# Patient Record
Sex: Female | Born: 1955 | Race: White | Hispanic: No | Marital: Married | Smoking: Never smoker
Health system: Southern US, Community
[De-identification: ages and names within clinical notes are randomized; demographics above are authoritative.]

## PROBLEM LIST (undated history)

## (undated) DIAGNOSIS — B019 Varicella without complication: Secondary | ICD-10-CM

## (undated) DIAGNOSIS — R87619 Unspecified abnormal cytological findings in specimens from cervix uteri: Secondary | ICD-10-CM

## (undated) DIAGNOSIS — R011 Cardiac murmur, unspecified: Secondary | ICD-10-CM

## (undated) DIAGNOSIS — T7840XA Allergy, unspecified, initial encounter: Secondary | ICD-10-CM

## (undated) DIAGNOSIS — M8580 Other specified disorders of bone density and structure, unspecified site: Secondary | ICD-10-CM

## (undated) DIAGNOSIS — E079 Disorder of thyroid, unspecified: Secondary | ICD-10-CM

## (undated) DIAGNOSIS — M1990 Unspecified osteoarthritis, unspecified site: Secondary | ICD-10-CM

## (undated) HISTORY — DX: Unspecified osteoarthritis, unspecified site: M19.90

## (undated) HISTORY — PX: COLONOSCOPY: SHX174

## (undated) HISTORY — PX: PILONIDAL CYST DRAINAGE: SHX743

## (undated) HISTORY — DX: Allergy, unspecified, initial encounter: T78.40XA

## (undated) HISTORY — PX: BREAST CYST ASPIRATION: SHX578

## (undated) HISTORY — DX: Varicella without complication: B01.9

## (undated) HISTORY — DX: Disorder of thyroid, unspecified: E07.9

## (undated) HISTORY — DX: Cardiac murmur, unspecified: R01.1

## (undated) HISTORY — DX: Other specified disorders of bone density and structure, unspecified site: M85.80

## (undated) HISTORY — DX: Unspecified abnormal cytological findings in specimens from cervix uteri: R87.619

## (undated) HISTORY — PX: TONSILLECTOMY AND ADENOIDECTOMY: SHX28

## (undated) HISTORY — PX: ABDOMINAL HYSTERECTOMY: SHX81

---

## 1997-02-25 ENCOUNTER — Ambulatory Visit (HOSPITAL_COMMUNITY): Admission: RE | Admit: 1997-02-25 | Discharge: 1997-02-25 | Payer: Self-pay | Admitting: Obstetrics and Gynecology

## 1998-03-05 ENCOUNTER — Encounter: Payer: Self-pay | Admitting: Obstetrics and Gynecology

## 1998-03-05 ENCOUNTER — Ambulatory Visit (HOSPITAL_COMMUNITY): Admission: RE | Admit: 1998-03-05 | Discharge: 1998-03-05 | Payer: Self-pay | Admitting: Obstetrics and Gynecology

## 1999-03-21 ENCOUNTER — Encounter: Payer: Self-pay | Admitting: Obstetrics and Gynecology

## 1999-03-21 ENCOUNTER — Ambulatory Visit (HOSPITAL_COMMUNITY): Admission: RE | Admit: 1999-03-21 | Discharge: 1999-03-21 | Payer: Self-pay | Admitting: Obstetrics and Gynecology

## 2000-07-23 ENCOUNTER — Ambulatory Visit (HOSPITAL_COMMUNITY): Admission: RE | Admit: 2000-07-23 | Discharge: 2000-07-23 | Payer: Self-pay | Admitting: Obstetrics and Gynecology

## 2000-07-23 ENCOUNTER — Encounter: Payer: Self-pay | Admitting: Obstetrics and Gynecology

## 2001-05-09 ENCOUNTER — Other Ambulatory Visit: Admission: RE | Admit: 2001-05-09 | Discharge: 2001-05-09 | Payer: Self-pay | Admitting: Obstetrics and Gynecology

## 2001-12-17 ENCOUNTER — Ambulatory Visit (HOSPITAL_COMMUNITY): Admission: RE | Admit: 2001-12-17 | Discharge: 2001-12-17 | Payer: Self-pay | Admitting: Obstetrics and Gynecology

## 2001-12-17 ENCOUNTER — Encounter: Payer: Self-pay | Admitting: Obstetrics and Gynecology

## 2001-12-20 ENCOUNTER — Encounter: Payer: Self-pay | Admitting: Obstetrics and Gynecology

## 2001-12-20 ENCOUNTER — Encounter: Admission: RE | Admit: 2001-12-20 | Discharge: 2001-12-20 | Payer: Self-pay | Admitting: General Surgery

## 2002-06-17 ENCOUNTER — Encounter: Payer: Self-pay | Admitting: Obstetrics and Gynecology

## 2002-06-17 ENCOUNTER — Encounter: Admission: RE | Admit: 2002-06-17 | Discharge: 2002-06-17 | Payer: Self-pay | Admitting: Obstetrics and Gynecology

## 2002-08-21 ENCOUNTER — Other Ambulatory Visit: Admission: RE | Admit: 2002-08-21 | Discharge: 2002-08-21 | Payer: Self-pay | Admitting: Obstetrics and Gynecology

## 2003-01-02 ENCOUNTER — Encounter: Admission: RE | Admit: 2003-01-02 | Discharge: 2003-01-02 | Payer: Self-pay | Admitting: Obstetrics and Gynecology

## 2003-01-26 ENCOUNTER — Encounter: Admission: RE | Admit: 2003-01-26 | Discharge: 2003-01-26 | Payer: Self-pay | Admitting: Obstetrics and Gynecology

## 2003-05-08 ENCOUNTER — Encounter: Admission: RE | Admit: 2003-05-08 | Discharge: 2003-05-08 | Payer: Self-pay | Admitting: Obstetrics and Gynecology

## 2003-09-14 ENCOUNTER — Other Ambulatory Visit: Admission: RE | Admit: 2003-09-14 | Discharge: 2003-09-14 | Payer: Self-pay | Admitting: Obstetrics and Gynecology

## 2004-02-18 ENCOUNTER — Ambulatory Visit: Payer: Self-pay | Admitting: Internal Medicine

## 2004-02-23 ENCOUNTER — Encounter: Admission: RE | Admit: 2004-02-23 | Discharge: 2004-02-23 | Payer: Self-pay | Admitting: Obstetrics and Gynecology

## 2004-04-26 ENCOUNTER — Ambulatory Visit: Payer: Self-pay | Admitting: Internal Medicine

## 2004-05-10 ENCOUNTER — Ambulatory Visit: Payer: Self-pay | Admitting: Internal Medicine

## 2004-10-11 ENCOUNTER — Other Ambulatory Visit: Admission: RE | Admit: 2004-10-11 | Discharge: 2004-10-11 | Payer: Self-pay | Admitting: Obstetrics and Gynecology

## 2004-10-13 ENCOUNTER — Ambulatory Visit: Payer: Self-pay | Admitting: Internal Medicine

## 2005-03-09 ENCOUNTER — Encounter: Admission: RE | Admit: 2005-03-09 | Discharge: 2005-03-09 | Payer: Self-pay | Admitting: Obstetrics and Gynecology

## 2005-03-29 ENCOUNTER — Encounter: Admission: RE | Admit: 2005-03-29 | Discharge: 2005-03-29 | Payer: Self-pay | Admitting: Obstetrics and Gynecology

## 2005-06-20 ENCOUNTER — Ambulatory Visit: Payer: Self-pay | Admitting: Internal Medicine

## 2005-10-23 ENCOUNTER — Other Ambulatory Visit: Admission: RE | Admit: 2005-10-23 | Discharge: 2005-10-23 | Payer: Self-pay | Admitting: Obstetrics and Gynecology

## 2006-05-14 ENCOUNTER — Encounter: Admission: RE | Admit: 2006-05-14 | Discharge: 2006-05-14 | Payer: Self-pay | Admitting: Obstetrics and Gynecology

## 2006-10-24 DIAGNOSIS — M8580 Other specified disorders of bone density and structure, unspecified site: Secondary | ICD-10-CM | POA: Insufficient documentation

## 2006-10-24 DIAGNOSIS — J309 Allergic rhinitis, unspecified: Secondary | ICD-10-CM | POA: Insufficient documentation

## 2007-02-12 HISTORY — PX: ROBOTIC ASSISTED SUPRACERVICAL HYSTERECTOMY: SHX6083

## 2007-05-30 ENCOUNTER — Encounter: Admission: RE | Admit: 2007-05-30 | Discharge: 2007-05-30 | Payer: Self-pay | Admitting: Obstetrics and Gynecology

## 2007-06-27 ENCOUNTER — Encounter: Payer: Self-pay | Admitting: Internal Medicine

## 2007-07-04 ENCOUNTER — Encounter: Payer: Self-pay | Admitting: Internal Medicine

## 2007-10-02 ENCOUNTER — Ambulatory Visit: Payer: Self-pay | Admitting: Internal Medicine

## 2007-10-02 DIAGNOSIS — N951 Menopausal and female climacteric states: Secondary | ICD-10-CM | POA: Insufficient documentation

## 2007-10-02 DIAGNOSIS — R911 Solitary pulmonary nodule: Secondary | ICD-10-CM | POA: Insufficient documentation

## 2007-10-02 LAB — CONVERTED CEMR LAB: Estradiol: 10.7 pg/mL

## 2007-10-07 LAB — CONVERTED CEMR LAB
FSH: 107 milliintl units/mL
LH: 44 milliintl units/mL

## 2007-10-21 ENCOUNTER — Telehealth: Payer: Self-pay | Admitting: Internal Medicine

## 2007-11-06 ENCOUNTER — Encounter: Payer: Self-pay | Admitting: Internal Medicine

## 2007-11-06 ENCOUNTER — Ambulatory Visit: Payer: Self-pay | Admitting: Internal Medicine

## 2007-11-20 ENCOUNTER — Ambulatory Visit: Payer: Self-pay | Admitting: Cardiovascular Disease

## 2007-12-04 ENCOUNTER — Ambulatory Visit: Payer: Self-pay | Admitting: Gastroenterology

## 2007-12-16 ENCOUNTER — Telehealth: Payer: Self-pay | Admitting: Gastroenterology

## 2007-12-16 ENCOUNTER — Telehealth: Payer: Self-pay | Admitting: Internal Medicine

## 2007-12-18 ENCOUNTER — Ambulatory Visit: Payer: Self-pay | Admitting: Gastroenterology

## 2007-12-19 ENCOUNTER — Ambulatory Visit: Payer: Self-pay | Admitting: Internal Medicine

## 2007-12-19 LAB — CONVERTED CEMR LAB: Calcium: 9.6 mg/dL (ref 8.4–10.5)

## 2007-12-26 LAB — CONVERTED CEMR LAB
Calcium, Total (PTH): 9.8 mg/dL (ref 8.4–10.5)
PTH: 19.6 pg/mL (ref 14.0–72.0)
Vit D, 1,25-Dihydroxy: 55 (ref 30–89)

## 2008-01-20 ENCOUNTER — Telehealth: Payer: Self-pay | Admitting: Internal Medicine

## 2008-01-21 ENCOUNTER — Telehealth: Payer: Self-pay | Admitting: Internal Medicine

## 2008-01-21 ENCOUNTER — Other Ambulatory Visit: Admission: RE | Admit: 2008-01-21 | Discharge: 2008-01-21 | Payer: Self-pay | Admitting: Obstetrics and Gynecology

## 2008-01-27 ENCOUNTER — Telehealth: Payer: Self-pay | Admitting: Gastroenterology

## 2008-01-29 ENCOUNTER — Telehealth: Payer: Self-pay | Admitting: Internal Medicine

## 2008-01-31 ENCOUNTER — Ambulatory Visit: Payer: Self-pay | Admitting: Gastroenterology

## 2008-01-31 LAB — HM COLONOSCOPY

## 2008-09-17 ENCOUNTER — Ambulatory Visit: Payer: Self-pay | Admitting: Family Medicine

## 2008-10-12 ENCOUNTER — Encounter: Admission: RE | Admit: 2008-10-12 | Discharge: 2008-10-12 | Payer: Self-pay | Admitting: Obstetrics and Gynecology

## 2009-10-21 ENCOUNTER — Encounter: Admission: RE | Admit: 2009-10-21 | Discharge: 2009-10-21 | Payer: Self-pay | Admitting: Obstetrics and Gynecology

## 2010-02-05 ENCOUNTER — Encounter: Payer: Self-pay | Admitting: Obstetrics and Gynecology

## 2010-08-22 ENCOUNTER — Encounter: Payer: Self-pay | Admitting: Internal Medicine

## 2010-08-29 ENCOUNTER — Encounter: Payer: Self-pay | Admitting: Internal Medicine

## 2010-08-29 ENCOUNTER — Ambulatory Visit (INDEPENDENT_AMBULATORY_CARE_PROVIDER_SITE_OTHER): Payer: 59 | Admitting: Internal Medicine

## 2010-08-29 ENCOUNTER — Ambulatory Visit: Payer: Self-pay | Admitting: Internal Medicine

## 2010-08-29 VITALS — BP 124/80 | HR 68 | Temp 98.0°F | Resp 14 | Ht 61.0 in | Wt 113.0 lb

## 2010-08-29 DIAGNOSIS — N959 Unspecified menopausal and perimenopausal disorder: Secondary | ICD-10-CM

## 2010-08-29 DIAGNOSIS — J984 Other disorders of lung: Secondary | ICD-10-CM

## 2010-08-29 DIAGNOSIS — T887XXA Unspecified adverse effect of drug or medicament, initial encounter: Secondary | ICD-10-CM

## 2010-08-29 DIAGNOSIS — M899 Disorder of bone, unspecified: Secondary | ICD-10-CM

## 2010-08-29 DIAGNOSIS — M949 Disorder of cartilage, unspecified: Secondary | ICD-10-CM

## 2010-08-29 NOTE — Assessment & Plan Note (Signed)
Screening a pulmonary nodule in the left lower lobe present on CT scan 2009. Follow up CT was missed discussed with patient monitoring CT scan

## 2010-08-29 NOTE — Progress Notes (Signed)
Subjective:    Patient ID: Angela [name], female    DOB: 1956/01/15, 55 y.o.   MRN: [id number]  HPI  Patient is a 55 year old white female who presents for followup.  She is having her women's wellness to her gynecologist she is seeing a specialist or hormone replacement.  She presents today for followup on multiple medical issues that had "fallen through the cracks" because she did not keep her routine physicals with me because she felt that she was covered because of her visits with these other specialists.  Other important note was that she had a pulmonary nodule that we are following she would do a followup CT scan she recognizes that she missed a followup visit for that and that we need to repeat the CT scan as well as a repeat bone density to see if the hormone therapy and vitamin D therapy are sufficient.    Review of Systems  Constitutional: Negative for activity change, appetite change and fatigue.  HENT: Negative for ear pain, congestion, neck pain, postnasal drip and sinus pressure.   Eyes: Negative for redness and visual disturbance.  Respiratory: Negative for cough, shortness of breath and wheezing.   Gastrointestinal: Negative for abdominal pain and abdominal distention.  Genitourinary: Negative for dysuria, frequency and menstrual problem.  Musculoskeletal: Negative for myalgias, joint swelling and arthralgias.  Skin: Negative for rash and wound.  Neurological: Negative for dizziness, weakness and headaches.  Hematological: Negative for adenopathy. Does not bruise/bleed easily.  Psychiatric/Behavioral: Negative for sleep disturbance and decreased concentration.   Past Medical History  Diagnosis Date  . Allergy   . Osteopenia    Past Surgical History  Procedure Date  . Abdominal hysterectomy 2009    ovaries remain  . Tonsillectomy and adenoidectomy   . Pilonidal cyst drainage     reports that she has never smoked. She does not have any smokeless tobacco  history on file. She reports that she drinks alcohol. She reports that she does not use illicit drugs. family history includes Cancer in her father; Hyperlipidemia in her father; Hypertension in her mother; and Osteoporosis in an unspecified family member. No Known Allergies      Objective:   Physical Exam  Nursing note and vitals reviewed. Constitutional: She is oriented to person, place, and time. She appears well-developed and well-nourished. No distress.  HENT:  Head: Normocephalic and atraumatic.  Right Ear: External ear normal.  Left Ear: External ear normal.  Nose: Nose normal.  Mouth/Throat: Oropharynx is clear and moist.  Eyes: Conjunctivae and EOM are normal. Pupils are equal, round, and reactive to light.  Neck: Normal range of motion. Neck supple. No JVD present. No tracheal deviation present. No thyromegaly present.  Cardiovascular: Normal rate, regular rhythm, normal heart sounds and intact distal pulses.   No murmur heard. Pulmonary/Chest: Effort normal and breath sounds normal. She has no wheezes. She exhibits no tenderness.  Abdominal: Soft. Bowel sounds are normal.  Musculoskeletal: Normal range of motion. She exhibits no edema and no tenderness.  Lymphadenopathy:    She has no cervical adenopathy.  Neurological: She is alert and oriented to person, place, and time. She has normal reflexes. No cranial nerve deficit.  Skin: Skin is warm and dry. She is not diaphoretic.  Psychiatric: She has a normal mood and affect. Her behavior is normal.          Assessment & Plan:  Followup CT scan for pulmonary nodule be met on the day prior to ordering the contrast study.  Bone density from moderate to osteopenia osteopenia is detected 2009 monitor for progression of osteopenia she is currently just on calcium topical hormone replacement and vitamin D therapy.  Her gynecologist is monitor her vitamin D level and she states that the keeping around 50 the counseled her about  excessive calcium in excess of vitamin D

## 2010-08-30 LAB — BASIC METABOLIC PANEL
BUN: 13 mg/dL (ref 6–23)
CO2: 27 mEq/L (ref 19–32)
Calcium: 9.7 mg/dL (ref 8.4–10.5)
Chloride: 102 mEq/L (ref 96–112)
Creatinine, Ser: 0.8 mg/dL (ref 0.4–1.2)
GFR: 77.31 mL/min (ref >60.00)
Glucose, Bld: 93 mg/dL (ref 70–99)
Potassium: 4.7 mEq/L (ref 3.5–5.1)
Sodium: 139 mEq/L (ref 135–145)

## 2010-08-31 ENCOUNTER — Other Ambulatory Visit: Payer: 59

## 2010-09-13 ENCOUNTER — Telehealth: Payer: Self-pay | Admitting: Internal Medicine

## 2010-09-13 MED ORDER — LORAZEPAM 0.5 MG PO TABS: 0.5000 mg | ORAL_TABLET | Freq: Every day | ORAL | Status: DC | PRN

## 2010-09-13 NOTE — Telephone Encounter (Signed)
Flying out of town and would like another rx for Ativan sent [facility]. Needs by this Thursday. Thanks.

## 2010-09-13 NOTE — Telephone Encounter (Signed)
Called in.

## 2010-10-13 ENCOUNTER — Other Ambulatory Visit: Payer: 59

## 2010-10-25 ENCOUNTER — Other Ambulatory Visit: Payer: Self-pay | Admitting: Obstetrics and Gynecology

## 2010-10-25 DIAGNOSIS — M899 Disorder of bone, unspecified: Secondary | ICD-10-CM

## 2010-10-25 DIAGNOSIS — M949 Disorder of cartilage, unspecified: Secondary | ICD-10-CM

## 2010-10-26 ENCOUNTER — Ambulatory Visit (INDEPENDENT_AMBULATORY_CARE_PROVIDER_SITE_OTHER)
Admission: RE | Admit: 2010-10-26 | Discharge: 2010-10-26 | Disposition: A | Discharge status: Home / Self Care | Payer: 59 | Source: Ambulatory Visit | Attending: Internal Medicine | Admitting: Internal Medicine

## 2010-10-26 DIAGNOSIS — M899 Disorder of bone, unspecified: Secondary | ICD-10-CM

## 2010-10-28 ENCOUNTER — Ambulatory Visit (INDEPENDENT_AMBULATORY_CARE_PROVIDER_SITE_OTHER)
Admission: RE | Admit: 2010-10-28 | Discharge: 2010-10-28 | Disposition: A | Discharge status: Home / Self Care | Payer: 59 | Source: Ambulatory Visit | Attending: Internal Medicine | Admitting: Internal Medicine

## 2010-10-28 DIAGNOSIS — J984 Other disorders of lung: Secondary | ICD-10-CM

## 2010-10-28 MED ORDER — IOHEXOL 300 MG/ML  SOLN
80.0000 mL | Freq: Once | INTRAMUSCULAR | Status: AC | PRN
  Administered 2010-10-28: 80 mL via INTRAVENOUS

## 2010-11-02 ENCOUNTER — Encounter: Payer: Self-pay | Admitting: Internal Medicine

## 2010-11-09 ENCOUNTER — Ambulatory Visit
Admission: RE | Admit: 2010-11-09 | Discharge: 2010-11-09 | Disposition: A | Discharge status: Home / Self Care | Payer: 59 | Source: Ambulatory Visit | Attending: Obstetrics and Gynecology | Admitting: Obstetrics and Gynecology

## 2010-11-09 DIAGNOSIS — M899 Disorder of bone, unspecified: Secondary | ICD-10-CM

## 2010-11-09 DIAGNOSIS — M949 Disorder of cartilage, unspecified: Secondary | ICD-10-CM

## 2011-04-10 LAB — HM PAP SMEAR: HM Pap smear: NEGATIVE

## 2011-10-19 ENCOUNTER — Other Ambulatory Visit: Payer: Self-pay | Admitting: Obstetrics and Gynecology

## 2011-10-19 DIAGNOSIS — Z1231 Encounter for screening mammogram for malignant neoplasm of breast: Secondary | ICD-10-CM

## 2011-11-15 ENCOUNTER — Ambulatory Visit
Admission: RE | Admit: 2011-11-15 | Discharge: 2011-11-15 | Disposition: A | Discharge status: Home / Self Care | Payer: 59 | Source: Ambulatory Visit | Attending: Obstetrics and Gynecology | Admitting: Obstetrics and Gynecology

## 2011-11-15 DIAGNOSIS — Z1231 Encounter for screening mammogram for malignant neoplasm of breast: Secondary | ICD-10-CM

## 2011-11-15 LAB — HM MAMMOGRAPHY: HM Mammogram: NEGATIVE

## 2012-03-14 ENCOUNTER — Other Ambulatory Visit: Payer: Self-pay | Admitting: Internal Medicine

## 2012-04-15 ENCOUNTER — Other Ambulatory Visit (INDEPENDENT_AMBULATORY_CARE_PROVIDER_SITE_OTHER): Payer: 59

## 2012-04-15 DIAGNOSIS — Z0000 Encounter for general adult medical examination without abnormal findings: Secondary | ICD-10-CM

## 2012-04-15 LAB — CBC WITH DIFFERENTIAL/PLATELET
Basophils Relative: 0.7 % (ref 0.0–3.0)
Eosinophils Absolute: 0 10*3/uL (ref 0.0–0.7)
Eosinophils Relative: 0.6 % (ref 0.0–5.0)
HCT: 40.4 % (ref 36.0–46.0)
Lymphs Abs: 1.1 10*3/uL (ref 0.7–4.0)
MCHC: 33.8 g/dL (ref 30.0–36.0)
MCV: 93 fl (ref 78.0–100.0)
Monocytes Absolute: 0.5 10*3/uL (ref 0.1–1.0)
Neutrophils Relative %: 50.7 % (ref 43.0–77.0)
Platelets: 175 10*3/uL (ref 150.0–400.0)
WBC: 3.4 10*3/uL — ABNORMAL LOW (ref 4.5–10.5)

## 2012-04-15 LAB — HEPATIC FUNCTION PANEL
ALT: 20 U/L (ref 0–35)
Bilirubin, Direct: 0.1 mg/dL (ref 0.0–0.3)
Total Bilirubin: 0.9 mg/dL (ref 0.3–1.2)
Total Protein: 7.3 g/dL (ref 6.0–8.3)

## 2012-04-15 LAB — BASIC METABOLIC PANEL
BUN: 15 mg/dL (ref 6–23)
CO2: 28 mEq/L (ref 19–32)
Chloride: 101 mEq/L (ref 96–112)
Creatinine, Ser: 0.8 mg/dL (ref 0.4–1.2)
Potassium: 4.5 mEq/L (ref 3.5–5.1)

## 2012-04-15 LAB — POCT URINALYSIS DIPSTICK
Nitrite, UA: NEGATIVE
Protein, UA: NEGATIVE
Urobilinogen, UA: 0.2
pH, UA: 6

## 2012-04-15 LAB — LIPID PANEL: Cholesterol: 189 mg/dL (ref 0–200)

## 2012-04-15 LAB — TSH: TSH: 0.07 u[IU]/mL — ABNORMAL LOW (ref 0.35–5.50)

## 2012-04-22 ENCOUNTER — Ambulatory Visit (INDEPENDENT_AMBULATORY_CARE_PROVIDER_SITE_OTHER): Payer: 59 | Admitting: Internal Medicine

## 2012-04-22 ENCOUNTER — Encounter: Payer: Self-pay | Admitting: Internal Medicine

## 2012-04-22 VITALS — BP 110/70 | HR 80 | Temp 98.1°F | Resp 14 | Ht 61.0 in | Wt 114.0 lb

## 2012-04-22 DIAGNOSIS — I10 Essential (primary) hypertension: Secondary | ICD-10-CM

## 2012-04-22 DIAGNOSIS — Z23 Encounter for immunization: Secondary | ICD-10-CM

## 2012-04-22 DIAGNOSIS — Z0000 Encounter for general adult medical examination without abnormal findings: Secondary | ICD-10-CM

## 2012-04-22 NOTE — Progress Notes (Signed)
Subjective:    Patient ID: Geraldo Docker, female    DOB: January 03, 1956, 57 y.o.   MRN: [id number]  HPI  Wellness exam stable  Review of Systems  Constitutional: Negative for activity change, appetite change and fatigue.  HENT: Negative for ear pain, congestion, neck pain, postnasal drip and sinus pressure.   Eyes: Negative for redness and visual disturbance.  Respiratory: Negative for cough, shortness of breath and wheezing.   Gastrointestinal: Negative for abdominal pain and abdominal distention.  Genitourinary: Negative for dysuria, frequency and menstrual problem.  Musculoskeletal: Negative for myalgias, joint swelling and arthralgias.  Skin: Negative for rash and wound.  Neurological: Negative for dizziness, weakness and headaches.  Hematological: Negative for adenopathy. Does not bruise/bleed easily.  Psychiatric/Behavioral: Negative for sleep disturbance and decreased concentration.   Past Medical History  Diagnosis Date  . Allergy   . Osteopenia     History   Social History  . Marital Status: Married    Spouse Name: N/A    Number of Children: N/A  . Years of Education: N/A   Occupational History  . Not on file.   Social History Main Topics  . Smoking status: Never Smoker   . Smokeless tobacco: Not on file  . Alcohol Use: Yes  . Drug Use: No  . Sexually Active: Yes   Other Topics Concern  . Not on file   Social History Narrative  . No narrative on file    Past Surgical History  Procedure Laterality Date  . Abdominal hysterectomy  2009    ovaries remain  . Tonsillectomy and adenoidectomy    . Pilonidal cyst drainage      Family History  Problem Relation Age of Onset  . Osteoporosis      fhx  . Hypertension Mother   . Hyperlipidemia Father   . Cancer Father     non hodgkins lymphoma    No Known Allergies  Current Outpatient Prescriptions on File Prior to Visit  Medication Sig Dispense Refill  . calcium carbonate (OS-CAL) 600 MG TABS Take  600 mg by mouth 2 (two) times daily with a meal.        . liothyronine (CYTOMEL) 5 MCG tablet Take 10 mcg by mouth daily.        Marland Kitchen LORazepam (ATIVAN) 0.5 MG tablet TAKE 1 TABLET BY MOUTH EVERY DAY AS NEEDED  30 tablet  1  . NALTREXONE HCL PO Take 3 mg by mouth daily.        . NON FORMULARY biest 7/3 (estriol/estradiol)       . NON FORMULARY Progesterone versa base (53ml)5(3.5-1.5)--7.5mg /ml cream       . spironolactone (ALDACTONE) 25 MG tablet Take 25 mg by mouth daily.         No current facility-administered medications on file prior to visit.    BP 110/70  Pulse 80  Temp(Src) 98.1 F (36.7 C)  Resp 14  Ht 5\' 1"  (1.549 m)  Wt 114 lb (51.71 kg)  BMI 21.55 kg/m2       Objective:   Physical Exam  Nursing note and vitals reviewed. Constitutional: She is oriented to person, place, and time. She appears well-developed and well-nourished. No distress.  HENT:  Head: Normocephalic and atraumatic.  Right Ear: External ear normal.  Left Ear: External ear normal.  Nose: Nose normal.  Mouth/Throat: Oropharynx is clear and moist.  Eyes: Conjunctivae and EOM are normal. Pupils are equal, round, and reactive to light.  Neck: Normal range of motion.  Neck supple. No JVD present. No tracheal deviation present. No thyromegaly present.  Cardiovascular: Normal rate, regular rhythm, normal heart sounds and intact distal pulses.   No murmur heard. Pulmonary/Chest: Effort normal and breath sounds normal. She has no wheezes. She exhibits no tenderness.  Abdominal: Soft. Bowel sounds are normal.  Musculoskeletal: Normal range of motion. She exhibits no edema and no tenderness.  Lymphadenopathy:    She has no cervical adenopathy.  Neurological: She is alert and oriented to person, place, and time. She has normal reflexes. No cranial nerve deficit.  Skin: Skin is warm and dry. She is not diaphoretic.  Psychiatric: She has a normal mood and affect. Her behavior is normal.          Assessment &  Plan:   This is a routine physical examination for this healthy  Female. Reviewed all health maintenance protocols including mammography colonoscopy bone density and reviewed appropriate screening labs. Her immunization history was reviewed as well as her current medications and allergies refills of her chronic medications were given and the plan for yearly health maintenance was discussed all orders and referrals were made as appropriate.

## 2012-04-22 NOTE — Addendum Note (Signed)
Addended by: Willy Eddy on: 04/22/2012 04:07 PM   Modules accepted: Orders

## 2012-06-17 ENCOUNTER — Encounter: Payer: Self-pay | Admitting: *Deleted

## 2012-06-21 ENCOUNTER — Ambulatory Visit (INDEPENDENT_AMBULATORY_CARE_PROVIDER_SITE_OTHER): Payer: 59 | Admitting: Nurse Practitioner

## 2012-06-21 ENCOUNTER — Encounter: Payer: Self-pay | Admitting: Nurse Practitioner

## 2012-06-21 VITALS — BP 112/60 | HR 62 | Resp 12 | Ht 62.0 in | Wt 113.2 lb

## 2012-06-21 DIAGNOSIS — M899 Disorder of bone, unspecified: Secondary | ICD-10-CM

## 2012-06-21 DIAGNOSIS — M8580 Other specified disorders of bone density and structure, unspecified site: Secondary | ICD-10-CM

## 2012-06-21 DIAGNOSIS — Z01419 Encounter for gynecological examination (general) (routine) without abnormal findings: Secondary | ICD-10-CM

## 2012-06-21 DIAGNOSIS — M949 Disorder of cartilage, unspecified: Secondary | ICD-10-CM

## 2012-06-21 NOTE — Patient Instructions (Addendum)

## 2012-06-21 NOTE — Progress Notes (Signed)
57 y.o. G3P3 Married Caucasian Fe here for annual exam.  Dr. Reita Chard has patient on Bio identical HRT and doing very well.  Rx is given with her. No new problems or diagnosis this past year. Angela [name] is now 57 yrs old.  No LMP recorded. Patient has had a hysterectomy.          Sexually active: yes  The current method of family planning is status post hysterectomy.    Exercising: yes  cardio, weights and tennis Smoker:  no  Health Maintenance: Pap:  04/10/2011  Normal with negative HR HPV MMG:  11/15/2011  Normal  Colonoscopy:  2010 repeat in 8 years.  Dr. Russella Dar BMD:   2009 Osteopenia TDaP:  2014 Labs: PCP does lab (blood) work and checks urine.    reports that she has never smoked. She has never used smokeless tobacco. She reports that she drinks about 2.0 ounces of alcohol per week. She reports that she does not use illicit drugs.  Past Medical History  Diagnosis Date  . Allergy   . Osteopenia     Past Surgical History  Procedure Laterality Date  . Abdominal hysterectomy  2009    ovaries remain  . Tonsillectomy and adenoidectomy    . Pilonidal cyst drainage    . Laparoscopic abdominal exploration      Current Outpatient Prescriptions  Medication Sig Dispense Refill  . calcium carbonate (OS-CAL) 600 MG TABS Take 600 mg by mouth 2 (two) times daily with a meal.        . estradiol (VIVELLE-DOT) 0.025 MG/24HR Place 1 patch onto the skin 2 (two) times a week.      . finasteride (PROPECIA) 1 MG tablet Take 0.5 mg by mouth daily.      . fish oil-omega-3 fatty acids 1000 MG capsule Take 2 g by mouth daily.      Marland Kitchen liothyronine (CYTOMEL) 5 MCG tablet Take 10 mcg by mouth daily.        Marland Kitchen LORazepam (ATIVAN) 0.5 MG tablet TAKE 1 TABLET BY MOUTH EVERY DAY AS NEEDED  30 tablet  1  . Multiple Vitamin (MULTIVITAMIN) tablet Take 1 tablet by mouth daily.      Marland Kitchen NALTREXONE HCL PO Take 3 mg by mouth daily.        . polycarbophil (FIBERCON) 625 MG tablet Take 625 mg by mouth daily.      .  progesterone (ENDOMETRIN) 100 MG vaginal insert Place 100 mg vaginally 2 (two) times daily.      Marland Kitchen spironolactone (ALDACTONE) 25 MG tablet Take 25 mg by mouth daily.        Marland Kitchen testosterone (ANDRODERM) 2.5 MG/24HR Place 1 patch onto the skin daily.      Marland Kitchen thyroid (ARMOUR) 60 MG tablet Take 60 mg by mouth daily.       No current facility-administered medications for this visit.    Family History  Problem Relation Age of Onset  . Osteoporosis      fhx  . Hypertension Mother   . Hyperlipidemia Father   . Cancer Father     non hodgkins lymphoma    ROS:  Pertinent items are noted in HPI.  Otherwise, a comprehensive ROS was negative.  Exam:   BP 112/60  Pulse 62  Resp 12  Ht 5\' 2"  (1.575 m)  Wt 113 lb 3.2 oz (51.347 kg)  BMI 20.7 kg/m2 Height: 5\' 2"  (157.5 cm)  Ht Readings from Last 3 Encounters:  06/21/12 5\' 2"  (1.575  m)  04/22/12 5\' 1"  (1.549 m)  08/29/10 5\' 1"  (1.549 m)    General appearance: alert, cooperative and appears stated age Head: Normocephalic, without obvious abnormality, atraumatic Neck: no adenopathy, supple, symmetrical, trachea midline and thyroid normal to inspection and palpation Lungs: clear to auscultation bilaterally Breasts: normal appearance, no masses or tenderness Heart: regular rate and rhythm Abdomen: soft, non-tender; no masses,  no organomegaly Extremities: extremities normal, atraumatic, no cyanosis or edema Skin: Skin color, texture, turgor normal. No rashes or lesions Lymph nodes: Cervical, supraclavicular, and axillary nodes normal. No abnormal inguinal nodes palpated Neurologic: Grossly normal   Pelvic: External genitalia:  no lesions              Urethra:  normal appearing urethra with no masses, tenderness or lesions              Bartholin's and Skene's: normal                 Vagina: normal appearing vagina with normal color and discharge, no lesions              Cervix: anteverted              Pap taken: no Bimanual Exam:  Uterus:   uterus absent              Adnexa: no mass, fullness, tenderness               Rectovaginal: Confirms               Anus:  normal sphincter tone, no lesions  A:  Well Woman with normal exam  S/P Lap Supracervical hysterectomy for adenomyosis and fibroids, ovaries remain 2009   On Bio identical HRT with Tammy Worrell  P:   Pap smear as per guidelines (pt. most likely will want pap again next year)  Mammogram due 10/14  counseled on breast self exam, osteoporosis, adequate intake of calcium and vitamin D,    diet and exercise, Kegel's exercises return annually or prn  An After Visit Summary was printed and given to the patient.

## 2012-06-24 NOTE — Progress Notes (Signed)
Reviewed personally.  M. Suzanne Hailie Searight, MD.  

## 2012-10-01 ENCOUNTER — Ambulatory Visit (INDEPENDENT_AMBULATORY_CARE_PROVIDER_SITE_OTHER): Payer: 59 | Admitting: Nurse Practitioner

## 2012-10-01 ENCOUNTER — Encounter: Payer: Self-pay | Admitting: Nurse Practitioner

## 2012-10-01 VITALS — BP 122/80 | HR 76 | Ht 62.0 in | Wt 115.0 lb

## 2012-10-01 DIAGNOSIS — Z87448 Personal history of other diseases of urinary system: Secondary | ICD-10-CM

## 2012-10-01 DIAGNOSIS — N905 Atrophy of vulva: Secondary | ICD-10-CM

## 2012-10-01 DIAGNOSIS — Z87898 Personal history of other specified conditions: Secondary | ICD-10-CM

## 2012-10-01 LAB — POCT URINALYSIS DIPSTICK
Bilirubin, UA: NEGATIVE
Blood, UA: NEGATIVE
Glucose, UA: NEGATIVE
Ketones, UA: NEGATIVE
Leukocytes, UA: NEGATIVE
Nitrite, UA: NEGATIVE
Protein, UA: NEGATIVE
Urobilinogen, UA: NEGATIVE
pH, UA: 6

## 2012-10-01 NOTE — Progress Notes (Signed)
Subjective:     Patient ID: Angela [name], female   DOB: 09/26/1955, 57 y.o.   MRN: [id number]  HPI  This 57 yo WM Fe presents with a vaginal "bump".  States she was having some uncomfortable fullness in th vaginal area and that she felt a bump or bulge.  She had her daughter to take a look and not sure if anything ws wrong.  She had noted an increase in urinary frequency but without dysuria about a week prior.  She has been very busy and went to the golf tournament and has been on the go recently.   Review of Systems  Constitutional: Negative for fever, chills and fatigue.  HENT: Negative.   Respiratory: Negative.   Cardiovascular: Negative.   Gastrointestinal: Negative.  Negative for nausea, vomiting, abdominal pain, diarrhea, constipation and blood in stool.  Genitourinary: Positive for frequency. Negative for urgency, decreased urine volume, vaginal bleeding, vaginal pain and pelvic pain.  Musculoskeletal: Negative.   Skin: Negative.   Neurological: Negative.   Psychiatric/Behavioral: Negative.        Objective:   Physical Exam  Constitutional: She is oriented to person, place, and time. She appears well-developed and well-nourished.  Abdominal: Soft. She exhibits no distension and no mass. There is no tenderness. There is no rebound and no guarding.  Genitourinary:  On exam patient has no evidence of Bartholin gland or other cyst or lesions.  Patient has atrophic vaginal changes.  She does have normal hymenal tissue that she points to as the 'abnormal' spot.  She also has a grade I cystocele that may have also caused the bulge feeling.  No rectocele.  Neurological: She is alert and oriented to person, place, and time.  Psychiatric: She has a normal mood and affect. Her behavior is normal. Judgment and thought content normal.   Urine is negative.     Assessment:     Urinary frequency Atrophic vaginal changes with grade I cystocele    Plan:     Reassurance is  given Continue with core strengthening and Kegel exercises

## 2012-10-08 NOTE — Progress Notes (Signed)
Encounter reviewed by Dr. Edita Weyenberg Silva.  

## 2013-04-01 ENCOUNTER — Other Ambulatory Visit: Payer: Self-pay

## 2013-04-01 DIAGNOSIS — Z1231 Encounter for screening mammogram for malignant neoplasm of breast: Secondary | ICD-10-CM

## 2013-04-22 ENCOUNTER — Ambulatory Visit
Admission: RE | Admit: 2013-04-22 | Discharge: 2013-04-22 | Disposition: A | Discharge status: Home / Self Care | Payer: 59 | Source: Ambulatory Visit

## 2013-04-22 DIAGNOSIS — Z1231 Encounter for screening mammogram for malignant neoplasm of breast: Secondary | ICD-10-CM

## 2013-06-24 ENCOUNTER — Ambulatory Visit: Payer: 59 | Admitting: Nurse Practitioner

## 2013-07-31 ENCOUNTER — Encounter: Payer: Self-pay | Admitting: Nurse Practitioner

## 2013-07-31 ENCOUNTER — Ambulatory Visit (INDEPENDENT_AMBULATORY_CARE_PROVIDER_SITE_OTHER): Payer: 59 | Admitting: Nurse Practitioner

## 2013-07-31 VITALS — BP 110/74 | HR 72 | Ht 62.25 in | Wt 115.0 lb

## 2013-07-31 DIAGNOSIS — R87619 Unspecified abnormal cytological findings in specimens from cervix uteri: Secondary | ICD-10-CM

## 2013-07-31 DIAGNOSIS — Z0000 Encounter for general adult medical examination without abnormal findings: Secondary | ICD-10-CM

## 2013-07-31 DIAGNOSIS — Z01419 Encounter for gynecological examination (general) (routine) without abnormal findings: Secondary | ICD-10-CM

## 2013-07-31 DIAGNOSIS — Z1211 Encounter for screening for malignant neoplasm of colon: Secondary | ICD-10-CM

## 2013-07-31 HISTORY — DX: Unspecified abnormal cytological findings in specimens from cervix uteri: R87.619

## 2013-07-31 LAB — POCT URINALYSIS DIPSTICK
BILIRUBIN UA: NEGATIVE
GLUCOSE UA: NEGATIVE
Ketones, UA: NEGATIVE
Leukocytes, UA: NEGATIVE
NITRITE UA: NEGATIVE
Protein, UA: NEGATIVE
RBC UA: NEGATIVE
Urobilinogen, UA: NEGATIVE
pH, UA: 8

## 2013-07-31 LAB — HEMOGLOBIN, FINGERSTICK: Hemoglobin, fingerstick: 13.2 g/dL (ref 12.0–16.0)

## 2013-07-31 NOTE — Progress Notes (Signed)
Patient ID: Angela [name], female   DOB: 07/22/1955, 58 y.o.   MRN: [id number] 58 y.o. G3P3 Married Caucasian Fe here for annual exam.  Still on bio identical HRT and doing well.  Patient's last menstrual period was 02/12/2007.          Sexually active: yes  The current method of family planning is status post hysterectomy.  Exercising: yes cardio, weights, spin class and tennis  Smoker: no   Health Maintenance:  Pap: 04/10/2011 Normal with negative HR HPV  MMG: 04/22/13, Bi-Rads 1: negative   Colonoscopy: 2010 repeat in 8 years. Dr. Fuller Plan  BMD: 2012 Osteopenia  TDaP: 2014  Labs:  HB:  13.2  Urine:  Negative   IFOB is given   reports that she has never smoked. She has never used smokeless tobacco. She reports that she drinks about 2 ounces of alcohol per week. She reports that she does not use illicit drugs.  Past Medical History  Diagnosis Date  . Allergy   . Osteopenia     Past Surgical History  Procedure Laterality Date  . Tonsillectomy and adenoidectomy  child  . Pilonidal cyst drainage  age 69  . Robotic assisted supracervical hysterectomy  02/12/07    Adenomyosis and fibroids; Ovaries remain; [facility]    Current Outpatient Prescriptions  Medication Sig Dispense Refill  . calcium carbonate (OS-CAL) 600 MG TABS Take 600 mg by mouth 2 (two) times daily with a meal.        . finasteride (PROPECIA) 1 MG tablet Take 0.5 mg by mouth daily.      Marland Kitchen liothyronine (CYTOMEL) 5 MCG tablet Take 10 mcg by mouth daily.        Marland Kitchen LORazepam (ATIVAN) 0.5 MG tablet TAKE 1 TABLET BY MOUTH EVERY DAY AS NEEDED  30 tablet  1  . Multiple Vitamin (MULTIVITAMIN) tablet Take 1 tablet by mouth daily.      Marland Kitchen NALTREXONE HCL PO Take 3 mg by mouth daily.        . NONFORMULARY OR COMPOUNDED ITEM Bi-Est plus progesterone cream.  Apply as directed.      . testosterone (ANDRODERM) 2.5 MG/24HR Place 1 patch onto the skin daily.      Marland Kitchen thyroid (ARMOUR) 60 MG tablet Take 60 mg by mouth daily.      .  fish oil-omega-3 fatty acids 1000 MG capsule Take 2 g by mouth daily.       No current facility-administered medications for this visit.    Family History  Problem Relation Age of Onset  . Osteoporosis      fhx  . Hypertension Mother   . Osteoporosis Mother   . Hyperlipidemia Father   . Cancer Father     non hodgkins lymphoma  . Hypertension Maternal Grandmother   . Stroke Maternal Grandmother     ROS:  Pertinent items are noted in HPI.  Otherwise, a comprehensive ROS was negative.  Exam:   BP 110/74  Pulse 72  Ht 5' 2.25" (1.581 m)  Wt 115 lb (52.164 kg)  BMI 20.87 kg/m2  LMP 02/12/2007 Height: 5' 2.25" (158.1 cm)  Ht Readings from Last 3 Encounters:  07/31/13 5' 2.25" (1.581 m)  10/01/12 5\' 2"  (1.575 m)  06/21/12 5\' 2"  (1.575 m)    General appearance: alert, cooperative and appears stated age Head: Normocephalic, without obvious abnormality, atraumatic Neck: no adenopathy, supple, symmetrical, trachea midline and thyroid normal to inspection and palpation Lungs: clear to auscultation bilaterally Breasts: normal appearance,  no masses or tenderness Heart: regular rate and rhythm Abdomen: soft, non-tender; no masses,  no organomegaly Extremities: extremities normal, atraumatic, no cyanosis or edema Skin: Skin color, texture, turgor normal. No rashes or lesions Lymph nodes: Cervical, supraclavicular, and axillary nodes normal. No abnormal inguinal nodes palpated Neurologic: Grossly normal   Pelvic: External genitalia:  no lesions              Urethra:  normal appearing urethra with no masses, tenderness or lesions              Bartholin's and Skene's: normal                 Vagina: normal appearing vagina with normal color and discharge, no lesions              Cervix: anteverted              Pap taken: Yes.   Bimanual Exam:  Uterus:  uterus absent              Adnexa: no mass, fullness, tenderness               Rectovaginal: Confirms               Anus:  normal  sphincter tone, no lesions  A:  Well Woman with normal exam  S/P supracervical hysterectomy secondary to adenomyosis and fibroids. 2009  P:   Reviewed health and wellness pertinent to exam  Pap smear taken today (requested because of a new partner - declines STD')  Mammogram is due 4/16  She gets bio identical HRT with PCP  IFOB is given  Counseled on breast self exam, mammography screening, adequate intake of calcium and vitamin D, diet and exercise, Kegel's exercises return annually or prn  An After Visit Summary was printed and given to the patient.

## 2013-07-31 NOTE — Patient Instructions (Signed)

## 2013-07-31 NOTE — Progress Notes (Signed)
Note reviewed, agree with plan.  Liylah Najarro, MD  

## 2013-08-03 LAB — IPS PAP TEST WITH HPV

## 2013-08-12 ENCOUNTER — Telehealth: Payer: Self-pay | Admitting: Emergency Medicine

## 2013-08-12 DIAGNOSIS — R87612 Low grade squamous intraepithelial lesion on cytologic smear of cervix (LGSIL): Secondary | ICD-10-CM

## 2013-08-12 NOTE — Telephone Encounter (Signed)
Message left to return call to [place] at [phone].   Type of birth control: Post Menopausal LMP 2009 Copayment: Needs insurance pre certification, order placed.   LSIL with HR HPV with Milford Cage, FNP

## 2013-08-12 NOTE — Telephone Encounter (Signed)
Message copied by Michele Mcalpine on Tue Aug 12, 2013  8:57 AM ------      Message from: Kem Boroughs R      Created: Sun Aug 03, 2013  5:19 PM       Please notify patient of abnormal pap and schedule colpo biopsy. ------

## 2013-08-12 NOTE — Telephone Encounter (Signed)
Patient notified of message from Milford Cage, [place].  She is agreeable to scheduling colposcopy. Brief description of procedure given to patient.  Colposcopy pre-procedure instructions given. Advised 800 mg of Motrin with food one hour prior to appointment. Motrin=Advil=Ibuprofen Can take 800 mg (Can purchase over the counter, you will need four 200 mg pills). Make sure to eat a meal before appointment and drink plenty of fluids. Patient verbalized understanding of instructions and reason for procedure. Advised will need to cancel within 24 hours or will have $100.00 no show fee placed to account. Patient agreeable.   Type of birth control Post menopausal LMP 2009 Copayment: pre-cert complete/pr $09//[id number] patient of this cost.  Notified of cancellation policy. Scheduled procedure room.  Linked order to procedure.   Routing to provider for final review. Patient agreeable to disposition. Will close encounter  cc Milford Cage, FNP

## 2013-08-28 ENCOUNTER — Encounter: Payer: Self-pay | Admitting: Obstetrics and Gynecology

## 2013-08-28 ENCOUNTER — Ambulatory Visit (INDEPENDENT_AMBULATORY_CARE_PROVIDER_SITE_OTHER): Payer: 59 | Admitting: Obstetrics and Gynecology

## 2013-08-28 VITALS — BP 122/68 | HR 82 | Resp 16 | Ht 62.25 in | Wt 118.8 lb

## 2013-08-28 DIAGNOSIS — R87612 Low grade squamous intraepithelial lesion on cytologic smear of cervix (LGSIL): Secondary | ICD-10-CM

## 2013-08-28 DIAGNOSIS — R87810 Cervical high risk human papillomavirus (HPV) DNA test positive: Secondary | ICD-10-CM

## 2013-08-28 NOTE — Progress Notes (Signed)
Subjective:     Patient ID: Angela [name], female   DOB: 1955/10/04, 58 y.o.   MRN: [id number]  HPI  GYNECOLOGY  VISIT   HPI: 58 y.o.   Married  Caucasian  female   G3P3 with Patient's last menstrual period was 02/12/2007.   here for  Colposcopy for pap 07/31/13 showing LGSIL and positive HR HPV.  Status post supracervical hysterectomy for adenomyosis and fibroids.   GYNECOLOGIC HISTORY: Patient's last menstrual period was 02/12/2007. Contraception:    Menopausal hormone therapy:         OB History   Grav Para Term Preterm Abortions TAB SAB Ect Mult Living   3 3        3          Patient Active Problem List   Diagnosis Date Noted  . SKIN RASH 09/17/2008  . PULMONARY NODULE, LEFT LOWER LOBE 10/02/2007  . SYMPTOMATIC MENOPAUSAL/FEMALE CLIMACTERIC STATES 10/02/2007  . DISORDER, MENOPAUSAL NOS 10/02/2007  . ALLERGIC RHINITIS 10/24/2006  . OSTEOPENIA 10/24/2006    Past Medical History  Diagnosis Date  . Allergy   . Osteopenia   . Abnormal Pap smear of cervix 07-31-13    --pt. 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    Past Surgical History  Procedure Laterality Date  . Tonsillectomy and adenoidectomy  child  . Pilonidal cyst drainage  age 53  . Robotic assisted supracervical hysterectomy  02/12/07    Adenomyosis and fibroids; Ovaries remain; [facility]    Current Outpatient Prescriptions  Medication Sig Dispense Refill  . calcium carbonate (OS-CAL) 600 MG TABS Take 600 mg by mouth 2 (two) times daily with a meal.        . finasteride (PROPECIA) 1 MG tablet Take 0.5 mg by mouth daily.      . fish oil-omega-3 fatty acids 1000 MG capsule Take 2 g by mouth daily.      Marland Kitchen liothyronine (CYTOMEL) 5 MCG tablet Take 10 mcg by mouth daily.        Marland Kitchen LORazepam (ATIVAN) 0.5 MG tablet TAKE 1 TABLET BY MOUTH EVERY DAY AS NEEDED  30 tablet  1  . Multiple Vitamin (MULTIVITAMIN) tablet Take 1 tablet by mouth daily.      Marland Kitchen NALTREXONE HCL PO Take 3 mg  by mouth daily.        . NONFORMULARY OR COMPOUNDED ITEM Bi-Est plus progesterone cream.  Apply as directed.      . testosterone (ANDRODERM) 2.5 MG/24HR Place 1 patch onto the skin daily.      Marland Kitchen thyroid (ARMOUR) 60 MG tablet Take 60 mg by mouth daily.       No current facility-administered medications for this visit.     ALLERGIES: Review of patient's allergies indicates no known allergies.  Family History  Problem Relation Age of Onset  . Osteoporosis      fhx  . Hypertension Mother   . Osteoporosis Mother   . Hyperlipidemia Father   . Cancer Father     non hodgkins lymphoma  . Hypertension Maternal Grandmother   . Stroke Maternal Grandmother     History   Social History  . Marital Status: Married    Spouse Name: N/A    Number of Children: N/A  . Years of Education: N/A   Occupational History  . Not on file.   Social History Main Topics  . Smoking status: Never Smoker   . Smokeless tobacco: Never Used  . Alcohol Use: 2.0 oz/week  4 drink(s) per week  . Drug Use: No  . Sexual Activity: Yes    Birth Control/ Protection: Surgical     Comment: hysterectomy   Other Topics Concern  . Not on file   Social History Narrative  . No narrative on file    Review of Systems  Pertinent items are noted in the HPI.    Objective:   Physical Exam  Genitourinary:        BP 122/68  Pulse 82  Resp 16  Ht 5' 2.25" (1.581 m)  Wt 118 lb 12.8 oz (53.887 kg)  BMI 21.56 kg/m2  LMP 02/12/2007    Colposcopy  Consent for procedure.  Speculum placed in vagina.  3% acetic acid placed.  Colposcopy satisfactory.  Biopsy of exocervix at 3 o'clock and sent to pathology. ECC performed and sent to pathology. Monsel's placed.  Minimal EBL. Speculum removed.   Acetic acid soaked gauze pads to vulva. Normal vulvar colposcopy.  OB lac vs. Episiotomy scar noted.  No complications.   Assessment:     LGSIL.  Positive HR HPV.  Vaginal atrophy.      Plan:       Discussion about abnormal pap and HPV.  Follow up colposcopic biopsies.  Post colpo instructions given.  OK to use estrogen cream vaginally and not just externally.

## 2013-08-28 NOTE — Patient Instructions (Signed)

## 2013-09-01 LAB — IPS OTHER TISSUE BIOPSY

## 2013-11-17 ENCOUNTER — Encounter: Payer: Self-pay | Admitting: Obstetrics and Gynecology

## 2014-07-21 ENCOUNTER — Encounter: Payer: Self-pay | Admitting: Gastroenterology

## 2014-08-21 ENCOUNTER — Encounter: Payer: Self-pay | Admitting: Nurse Practitioner

## 2014-08-21 ENCOUNTER — Ambulatory Visit (INDEPENDENT_AMBULATORY_CARE_PROVIDER_SITE_OTHER): Payer: 59 | Admitting: Nurse Practitioner

## 2014-08-21 VITALS — BP 90/62 | HR 68 | Ht 61.75 in | Wt 113.0 lb

## 2014-08-21 DIAGNOSIS — Z01419 Encounter for gynecological examination (general) (routine) without abnormal findings: Secondary | ICD-10-CM

## 2014-08-21 DIAGNOSIS — Z0000 Encounter for general adult medical examination without abnormal findings: Secondary | ICD-10-CM | POA: Diagnosis not present

## 2014-08-21 LAB — POCT URINALYSIS DIPSTICK
Bilirubin, UA: NEGATIVE
Blood, UA: NEGATIVE
Glucose, UA: NEGATIVE
KETONES UA: NEGATIVE
Leukocytes, UA: NEGATIVE
Nitrite, UA: NEGATIVE
PROTEIN UA: NEGATIVE
Urobilinogen, UA: NEGATIVE
pH, UA: 7

## 2014-08-21 LAB — HEMOGLOBIN, FINGERSTICK: HEMOGLOBIN, FINGERSTICK: 13 g/dL (ref 12.0–16.0)

## 2014-08-21 NOTE — Progress Notes (Signed)
Patient ID: Angela [name], female   DOB: 1955-06-10, 59 y.o.   MRN: [id number] 59 y.o. G11P0003 Married  Caucasian Fe here for annual exam.  She is doing well.  Sees Dr. Adria Dill for bio identical HRT.  Recent labs including TSH was just done there and will send [place] a copy.  Patient's last menstrual period was 02/12/2007 (exact date).          Sexually active: Yes.    The current method of family planning is status post hysterectomy.    Exercising: Yes.    Gym/ health club routine includes spin class, weights and tennis 3 days per week. Smoker:  no  Health Maintenance: Pap:   07/31/13, LGSIL with pos HR HPV; Colpo 08/28/13, CIN-I MMG: 04/22/13, Bi-Rads 1: negative  Colonoscopy: 2010 repeat in 8 years. Dr. Fuller Plan  BMD: 10/26/2010 Osteopenia T Score: spine -0.7, left hip neck -1.8, right hip neck -2.1 TDaP: 2014  Labs:  HB:  13.0  Urine:  Negative    reports that she has never smoked. She has never used smokeless tobacco. She reports that she drinks about 2.0 oz of alcohol per week. She reports that she does not use illicit drugs.  Past Medical History  Diagnosis Date  . Allergy   . Osteopenia   . Abnormal Pap smear of cervix 07-31-13    --pt. 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    Past Surgical History  Procedure Laterality Date  . Tonsillectomy and adenoidectomy  child  . Pilonidal cyst drainage  age 71  . Robotic assisted supracervical hysterectomy  02/12/07    Adenomyosis and fibroids; Ovaries remain; [facility]    Current Outpatient Prescriptions  Medication Sig Dispense Refill  . calcium carbonate (OS-CAL) 600 MG TABS Take 600 mg by mouth 2 (two) times daily with a meal.      . fish oil-omega-3 fatty acids 1000 MG capsule Take 2 g by mouth daily.    Marland Kitchen levothyroxine (SYNTHROID, LEVOTHROID) 50 MCG tablet Take 1 tablet by mouth daily.    Marland Kitchen liothyronine (CYTOMEL) 5 MCG tablet Take 10 mcg by mouth daily.      Marland Kitchen LORazepam (ATIVAN) 0.5 MG  tablet TAKE 1 TABLET BY MOUTH EVERY DAY AS NEEDED 30 tablet 1  . Multiple Vitamin (MULTIVITAMIN) tablet Take 1 tablet by mouth daily.    Marland Kitchen NALTREXONE HCL PO Take 3 mg by mouth daily.      . NONFORMULARY OR COMPOUNDED ITEM Bi-Est plus progesterone cream.  Apply as directed.     No current facility-administered medications for this visit.    Family History  Problem Relation Age of Onset  . Osteoporosis      fhx  . Hypertension Mother   . Osteoporosis Mother   . Hyperlipidemia Father   . Cancer Father     non hodgkins lymphoma  . Hypertension Maternal Grandmother   . Stroke Maternal Grandmother     ROS:  Pertinent items are noted in HPI.  Otherwise, a comprehensive ROS was negative.  Exam:   BP 90/62 mmHg  Pulse 68  Ht 5' 1.75" (1.568 m)  Wt 113 lb (51.256 kg)  BMI 20.85 kg/m2  LMP 02/12/2007 (Exact Date) Height: 5' 1.75" (156.8 cm) Ht Readings from Last 3 Encounters:  08/21/14 5' 1.75" (1.568 m)  08/28/13 5' 2.25" (1.581 m)  07/31/13 5' 2.25" (1.581 m)    General appearance: alert, cooperative and appears stated age Head: Normocephalic, without obvious abnormality, atraumatic Neck: no adenopathy,  supple, symmetrical, trachea midline and thyroid normal to inspection and palpation Lungs: clear to auscultation bilaterally Breasts: normal appearance, no masses or tenderness Heart: regular rate and rhythm Abdomen: soft, non-tender; no masses,  no organomegaly Extremities: extremities normal, atraumatic, no cyanosis or edema Skin: Skin color, texture, turgor normal. No rashes or lesions Lymph nodes: Cervical, supraclavicular, and axillary nodes normal. No abnormal inguinal nodes palpated Neurologic: Grossly normal   Pelvic: External genitalia:  no lesions              Urethra:  normal appearing urethra with no masses, tenderness or lesions              Bartholin's and Skene's: normal                 Vagina: normal appearing vagina with normal color and discharge, no  lesions              Cervix: anteverted              Pap taken: Yes.   Bimanual Exam:  Uterus:  uterus absent              Adnexa: no mass, fullness, tenderness               Rectovaginal: Confirms               Anus:  normal sphincter tone, no lesions  Chaperone present:  yes  A:  Well Woman with normal exam  S/P supracervical hysterectomy secondary to adenomyosis and fibroids. 2009  S/P colpo biopsy 08/2013  Bio identical HRT per Dr. Deirdre Pippins    P:   Reviewed health and wellness pertinent to exam  Pap smear as above  Mammogram is past due and must schedule  No RX for HRT as this is done by another MD  Follow with Pap and give pt a call.  Will get Dr.Vaughan to schedule a repeat BMD - if not then we can  Given names of new PCP  Counseled on breast self exam, mammography screening, use and side effects of HRT, adequate intake of calcium and vitamin D, diet and exercise return annually or prn  An After Visit Summary was printed and given to the patient.

## 2014-08-21 NOTE — Patient Instructions (Addendum)

## 2014-08-22 NOTE — Progress Notes (Signed)
Encounter reviewed by Dr. Liley Rake Amundson C. Silva.  

## 2014-08-27 LAB — IPS PAP TEST WITH HPV

## 2014-08-28 ENCOUNTER — Other Ambulatory Visit: Payer: Self-pay | Admitting: Certified Nurse Midwife

## 2014-08-28 DIAGNOSIS — IMO0002 Reserved for concepts with insufficient information to code with codable children: Secondary | ICD-10-CM

## 2014-08-31 ENCOUNTER — Ambulatory Visit: Payer: Self-pay | Admitting: Nurse Practitioner

## 2014-08-31 ENCOUNTER — Telehealth: Payer: Self-pay

## 2014-08-31 DIAGNOSIS — IMO0002 Reserved for concepts with insufficient information to code with codable children: Secondary | ICD-10-CM

## 2014-08-31 NOTE — Telephone Encounter (Signed)
-----   Message from Regina Eck, CNM sent at 08/28/2014 12:14 PM EDT ----- Notify patient that pap smear showed ASCUS with positive HPVHR, due to previous abnormal and this one abnormal will need colposcopy exam . Please schedule. Order in

## 2014-08-31 NOTE — Telephone Encounter (Signed)
Spoke with patient. Advised of results as seen below from [place]. Patient is agreeable and verbalizes understanding. Patient had colposcopy last year on 08/28/2013 with Dr.Silva. Is requesting to have colpo performed with Dr.Silva again. Appointment scheduled for 09/09/2014 at 3pm with Dr.Silva. Agreeable to date and time.  Instructions given. Motrin 800 mg po x , one hour before appointment with food. Make sure to eat a meal before appointment and drink plenty of fluids. Patient verbalized understanding and will call to reschedule if will be on menses or has any concerns regarding pregnancy. Advised will need to cancel within 72 hours or will have $150.00 late cancellation fee placed to account. Patient agreeable and verbalized understanding of all instructions. New order placed for precert.  Cc: Theresia Lo  Routing to provider for final review. Patient agreeable to disposition. Will close encounter.   Patient aware provider will review message and nurse will return call if any additional advice or change of disposition.

## 2014-09-02 ENCOUNTER — Telehealth: Payer: Self-pay | Admitting: Nurse Practitioner

## 2014-09-02 NOTE — Telephone Encounter (Signed)
Patient calling requesting to speak with Kem Boroughs, NP about her recent Pap results. She is aware a triage nurse will call her back.

## 2014-09-02 NOTE — Telephone Encounter (Signed)
Spoke with patient. Patient has further questions regarding her pap smear. Patient is asking if HPV was detected at last years pap smear. Advised HPV was detected in 2015 and this year. Advised of importance of keeping colposcopy appointment for further evaluation to ensure no further cell changes that we can not see with her pap. Asking if this is cancer. Advised this does not mean that she has cancer. Advised colposcopy will allow [place] to further evaluate the cells of her cervix to rule out any further cell changes up to and including cancer but pap results do not mean she has cancer. Patient is agreeable. Colposcopy is scheduled for 8/24 at 3pm with Dr.Silva. Patient is agreeable and will keep this appointment as scheduled.  Cc: Milford Cage, FNP   Routing to provider for final review. Patient agreeable to disposition. Will close encounter.   Patient aware provider will review message and nurse will return call if any additional advice or change of disposition.

## 2014-09-08 ENCOUNTER — Telehealth: Payer: Self-pay | Admitting: Obstetrics and Gynecology

## 2014-09-08 NOTE — Telephone Encounter (Signed)
Spoke with patient. Reviewed benefit for procedure. Patient understood and agreeable. Verified arrival date/time for procedure. Patient agreeable. Ok to close. °

## 2014-09-09 ENCOUNTER — Encounter: Payer: Self-pay | Admitting: Obstetrics and Gynecology

## 2014-09-09 ENCOUNTER — Ambulatory Visit (INDEPENDENT_AMBULATORY_CARE_PROVIDER_SITE_OTHER): Payer: 59 | Admitting: Obstetrics and Gynecology

## 2014-09-09 DIAGNOSIS — IMO0002 Reserved for concepts with insufficient information to code with codable children: Secondary | ICD-10-CM

## 2014-09-09 DIAGNOSIS — R896 Abnormal cytological findings in specimens from other organs, systems and tissues: Secondary | ICD-10-CM | POA: Diagnosis not present

## 2014-09-09 NOTE — Progress Notes (Signed)
Subjective:     Patient ID: Angela [name], female   DOB: 1955-08-23, 59 y.o.   MRN: [id number]  HPI  Patient here for colposcopy today.  Pap on 08-21-14 revealed Ascus:Pos. HR HPV.  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  Patient upset about the HPV.  States she does not remember this from last year when she had her last colposcopy.   Remove history of abnormal pap in her lae 30s or early 40s.  No treatment.   Husband had another partner some years ago, and patient is uncertain if this is a sign that he has someone else again.  Review of Systems  LMP:  Supracervical Hysterectomy 2009 - adenomyosis.  Contraception: Supracervical Hysterectomy    Objective:   Physical Exam  Genitourinary:        Colposcopy of the cervix, vagina, and vulva.  Consent for procedure.  Speculum placed in vagina.  3% acetic acid in vagina and on cervix.  Satisfactory colposcopy.  Thin rim of acetowhite change at 6 and 9:00.   Biopsies of each sent to pathology separately.  ECC sent to pathology separately.  Monsel's placed.   Acetic acid soaked gauze to vulva.  Medial labia minora with cobble stone change and acetowhite appearance over majority of medial surfaces. Local 1% lidocaine [id number], expiration 01/17/15 to medial left labia minora. 3 mm punch biopsy performed to this area.  Tissue to pathology separately. AgNO3 to area and then single suture of 3/0 vicryl.  Good hemostasis.  No complications.  Minimal EBL.      Assessment:     ASCUS, positive HR HPV.  Hx LGSIL. History of supracervical hysterectomy.     Plan:     Extensive discussion regarding HPV, abnormal paps, dysplasia, and treatment options of LEEP/conization for moderate to high grade dysplasia. Follow up biopsies.  Instructions/precautions given. Discussed cervical removal can be performed and that this will not eradicate the HPV.     __15_____ minutes face to face time of  which over 50% was spent in counseling.    Of note, EPIC had a system wide failure when this patient was in the office and this documentation was completed after the visit. Electronic records were not available during her procedure with me.

## 2014-09-14 LAB — IPS OTHER TISSUE BIOPSY

## 2014-09-17 ENCOUNTER — Telehealth: Payer: Self-pay | Admitting: Emergency Medicine

## 2014-09-17 NOTE — Telephone Encounter (Signed)
Patient returned call and message from Dr. Quincy Simmonds given.  Patient verbalized understanding of results and follow up in one year.  08 Recall in place.  Routing to provider for final review. Patient agreeable to disposition. Will close encounter.

## 2014-09-17 NOTE — Telephone Encounter (Signed)
Message left to return call to [place] at [phone].   08 recall placed.

## 2014-09-17 NOTE — Telephone Encounter (Signed)
-----   Message from Nunzio Cobbs, MD sent at 09/17/2014  5:44 AM EDT ----- Please inform of colpo biopsy results showing: - atypia of biopsy of exocervix (This is a very minor abnormal change to the cervix and indicates the HPV effect.) - negative ECC - minimal atypia of the vulva (HPV effect)  There is no sign of cancer.  Treatment is not indicated.  Next pap and HR HPV test in one year recommended.  Please enter 08 recall.   Cc- Marisa Sprinkles

## 2015-03-01 ENCOUNTER — Ambulatory Visit: Payer: Self-pay | Admitting: Family Medicine

## 2015-07-28 ENCOUNTER — Other Ambulatory Visit: Payer: Self-pay | Admitting: Nurse Practitioner

## 2015-07-28 DIAGNOSIS — Z1231 Encounter for screening mammogram for malignant neoplasm of breast: Secondary | ICD-10-CM

## 2015-08-05 ENCOUNTER — Ambulatory Visit
Admission: RE | Admit: 2015-08-05 | Discharge: 2015-08-05 | Disposition: A | Discharge status: Home / Self Care | Payer: 59 | Source: Ambulatory Visit | Attending: Nurse Practitioner | Admitting: Nurse Practitioner

## 2015-08-05 DIAGNOSIS — Z1231 Encounter for screening mammogram for malignant neoplasm of breast: Secondary | ICD-10-CM

## 2015-08-25 ENCOUNTER — Encounter: Payer: Self-pay | Admitting: Nurse Practitioner

## 2015-08-25 ENCOUNTER — Ambulatory Visit (INDEPENDENT_AMBULATORY_CARE_PROVIDER_SITE_OTHER): Payer: 59 | Admitting: Nurse Practitioner

## 2015-08-25 VITALS — BP 104/60 | HR 60 | Resp 12 | Ht 61.0 in | Wt 116.2 lb

## 2015-08-25 DIAGNOSIS — R87810 Cervical high risk human papillomavirus (HPV) DNA test positive: Secondary | ICD-10-CM | POA: Diagnosis not present

## 2015-08-25 DIAGNOSIS — Z01419 Encounter for gynecological examination (general) (routine) without abnormal findings: Secondary | ICD-10-CM | POA: Diagnosis not present

## 2015-08-25 DIAGNOSIS — Z0000 Encounter for general adult medical examination without abnormal findings: Secondary | ICD-10-CM | POA: Diagnosis not present

## 2015-08-25 NOTE — Progress Notes (Signed)
60 y.o. G23P0003 Married  Caucasian Fe here for annual exam. She is still on Bio identical HRT with Sanda Klein NP in [place].  She is doing well and wants to continue with them.  Thyroid is followed by her as well.  No new health problems.  Patient's last menstrual period was 02/12/2007 (exact date).          Sexually active: Yes.    The current method of family planning is status post hysterectomy.    Exercising: Yes.    Spin, Tennis, Weights Smoker:  no  Health Maintenance: Pap:  08/21/14 ASCUS + HR HPV; Colpo: 09/09/14 Atypia of vulva MMG:  08/05/15 BIRADS1; Density B Colonoscopy:  01/31/2008 normal and repeat in 10 yrs BMD:   10/26/2010 Osteopenia will schedule with new PCP TDaP:  04/22/2012 Hep C and HIV: will discuss with PCP Labs: Per patient Sanda Klein, NP   reports that she has never smoked. She has never used smokeless tobacco. She reports that she drinks about 2.4 oz of alcohol per week . She reports that she does not use drugs.  Past Medical History:  Diagnosis Date  . Abnormal Pap smear of cervix 07-31-13   --pt. 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 Allergy   . Osteopenia     Past Surgical History:  Procedure Laterality Date  . PILONIDAL CYST DRAINAGE  age 7  . ROBOTIC ASSISTED SUPRACERVICAL HYSTERECTOMY  02/12/07   Adenomyosis and fibroids; Ovaries remain; [place]  . TONSILLECTOMY AND ADENOIDECTOMY  child    Current Outpatient Prescriptions  Medication Sig Dispense Refill  . calcium carbonate (OS-CAL) 600 MG TABS Take 600 mg by mouth 2 (two) times daily with a meal.      . fish oil-omega-3 fatty acids 1000 MG capsule Take 2 g by mouth daily.    Marland Kitchen liothyronine (CYTOMEL) 5 MCG tablet Take 10 mcg by mouth daily.      Marland Kitchen LORazepam (ATIVAN) 0.5 MG tablet TAKE 1 TABLET BY MOUTH EVERY DAY AS NEEDED 30 tablet 1  . Multiple Vitamin (MULTIVITAMIN) tablet Take 1 tablet by mouth daily.    . NONFORMULARY OR COMPOUNDED ITEM Bi-Est plus  progesterone cream.  Apply as directed.    Marland Kitchen SYNTHROID 75 MCG tablet Take 1 tablet by mouth daily.     No current facility-administered medications for this visit.     Family History  Problem Relation Age of Onset  . Osteoporosis      fhx  . Hypertension Mother   . Osteoporosis Mother   . Hyperlipidemia Father   . Cancer Father     non hodgkins lymphoma  . Hypertension Maternal Grandmother   . Stroke Maternal Grandmother     ROS:  Pertinent items are noted in HPI.  Otherwise, a comprehensive ROS was negative.  Exam:   BP 104/60 (BP Location: Right Arm, Patient Position: Sitting, Cuff Size: Small)   Pulse 60   Resp 12   Ht 5\' 1"  (1.549 m)   Wt 116 lb 3.2 oz (52.7 kg)   LMP 02/12/2007 (Exact Date)   BMI 21.96 kg/m  Height: 5\' 1"  (154.9 cm) Ht Readings from Last 3 Encounters:  08/25/15 5\' 1"  (1.549 m)  09/09/14 5' 1.75" (1.568 m)  08/21/14 5' 1.75" (1.568 m)    General appearance: alert, cooperative and appears stated age Head: Normocephalic, without obvious abnormality, atraumatic Neck: no adenopathy, supple, symmetrical, trachea midline and thyroid normal to inspection and palpation Lungs: clear to auscultation  bilaterally Breasts: normal appearance, no masses or tenderness Heart: regular rate and rhythm Abdomen: soft, non-tender; no masses,  no organomegaly Extremities: extremities normal, atraumatic, no cyanosis or edema Skin: Skin color, texture, turgor normal. No rashes or lesions Lymph nodes: Cervical, supraclavicular, and axillary nodes normal. No abnormal inguinal nodes palpated Neurologic: Grossly normal   Pelvic: External genitalia:  no lesions              Urethra:  normal appearing urethra with no masses, tenderness or lesions              Bartholin's and Skene's: normal                 Vagina: normal appearing vagina with normal color and discharge, no lesions              Cervix: anteverted              Pap taken: Yes.   Bimanual Exam:  Uterus:   uterus absent              Adnexa: no mass, fullness, tenderness               Rectovaginal: Confirms               Anus:  normal sphincter tone, no lesions  Chaperone present: yes  A:  Well Woman with normal exam            S/P supracervical hysterectomy secondary to adenomyosis and fibroids. 2009             S/P colpo biopsy 08/2013, 08/2014              Bio identical HRT per Sanda Klein NP in [place]                        P:   Reviewed health and wellness pertinent to exam  Pap smear is done and will follow with report - may need a repeat in 6-12 mos.  Mammogram is due 07/2016  HRT by another provider  Counseled on breast self exam, mammography screening, use and side effects of HRT, adequate intake of calcium and vitamin D, diet and exercise return annually or prn  An After Visit Summary was printed and given to the patient.

## 2015-08-25 NOTE — Patient Instructions (Addendum)

## 2015-08-26 NOTE — Progress Notes (Signed)
Reviewed personally.  M. Suzanne Yacine Droz, MD.  

## 2015-08-27 NOTE — Addendum Note (Signed)
Addended by: Kem Boroughs R on: 08/27/2015 04:40 PM   Modules accepted: Orders

## 2015-09-02 LAB — IPS PAP TEST WITH HPV

## 2016-03-29 DIAGNOSIS — M545 Low back pain: Secondary | ICD-10-CM | POA: Diagnosis not present

## 2016-03-29 DIAGNOSIS — M62830 Muscle spasm of back: Secondary | ICD-10-CM | POA: Diagnosis not present

## 2016-03-29 DIAGNOSIS — M9903 Segmental and somatic dysfunction of lumbar region: Secondary | ICD-10-CM | POA: Diagnosis not present

## 2016-04-05 DIAGNOSIS — I8311 Varicose veins of right lower extremity with inflammation: Secondary | ICD-10-CM | POA: Diagnosis not present

## 2016-04-05 DIAGNOSIS — I8312 Varicose veins of left lower extremity with inflammation: Secondary | ICD-10-CM | POA: Diagnosis not present

## 2016-04-12 DIAGNOSIS — M62830 Muscle spasm of back: Secondary | ICD-10-CM | POA: Diagnosis not present

## 2016-04-12 DIAGNOSIS — M9903 Segmental and somatic dysfunction of lumbar region: Secondary | ICD-10-CM | POA: Diagnosis not present

## 2016-04-12 DIAGNOSIS — E038 Other specified hypothyroidism: Secondary | ICD-10-CM | POA: Diagnosis not present

## 2016-04-12 DIAGNOSIS — M545 Low back pain: Secondary | ICD-10-CM | POA: Diagnosis not present

## 2016-04-12 DIAGNOSIS — E2749 Other adrenocortical insufficiency: Secondary | ICD-10-CM | POA: Diagnosis not present

## 2016-04-13 LAB — TSH: TSH: 0.27 — AB (ref 0.41–5.90)

## 2016-04-13 LAB — VITAMIN D 25 HYDROXY (VIT D DEFICIENCY, FRACTURES): Vit D, 25-Hydroxy: 30.3

## 2016-05-10 DIAGNOSIS — M545 Low back pain: Secondary | ICD-10-CM | POA: Diagnosis not present

## 2016-05-10 DIAGNOSIS — M9903 Segmental and somatic dysfunction of lumbar region: Secondary | ICD-10-CM | POA: Diagnosis not present

## 2016-05-10 DIAGNOSIS — M62830 Muscle spasm of back: Secondary | ICD-10-CM | POA: Diagnosis not present

## 2016-06-05 DIAGNOSIS — M62830 Muscle spasm of back: Secondary | ICD-10-CM | POA: Diagnosis not present

## 2016-06-05 DIAGNOSIS — M545 Low back pain: Secondary | ICD-10-CM | POA: Diagnosis not present

## 2016-06-05 DIAGNOSIS — M9903 Segmental and somatic dysfunction of lumbar region: Secondary | ICD-10-CM | POA: Diagnosis not present

## 2016-06-21 ENCOUNTER — Telehealth: Payer: Self-pay | Admitting: Nurse Practitioner

## 2016-06-21 NOTE — Telephone Encounter (Signed)
Patient has a question for the nurse. No information giv3en.

## 2016-06-22 MED ORDER — NONFORMULARY OR COMPOUNDED ITEM: 0 refills | Status: DC

## 2016-06-22 MED ORDER — SYNTHROID 75 MCG PO TABS: 75.0000 ug | ORAL_TABLET | Freq: Every day | ORAL | 0 refills | Status: DC

## 2016-06-22 NOTE — Telephone Encounter (Signed)
Spoke with patient. Patient states that she was getting her hormones through another provider who has relocated to another practice to work in a different field. Patient was last seen by Kem Boroughs, FNP on 08/25/2015 for aex with Kem Boroughs, FNP. Patient will be going out of town and is almost out of her "Estradiol 0.5 ml/ 1 ml (biest 5 mg) (58;34 ml) 2-4 clicks per day" and her synthroid 75 mcg daily. Asking if our office can fill these prescriptions for 1 month until she is seen for her aex and can discuss with Kem Boroughs, FNP.  Routing to [place] for review and advise.

## 2016-06-22 NOTE — Telephone Encounter (Signed)
Ok to refill the Biest and the Synthroid dosage as noted. Ok to give each for one month and one month refill.  Please note the patient is scheduled to see [place] in 2 months.  She will need an office visit and labs for further prescriptions.

## 2016-06-22 NOTE — Telephone Encounter (Signed)
Sign rx for Bi-Est faxed to [facility] at [phone]. Patient has been notified of both rx refills and is agreeable.  Routing to provider for final review. Patient agreeable to disposition. Will close encounter.

## 2016-06-22 NOTE — Telephone Encounter (Signed)
Rx for Synthroid 75 mcg daily #30 0RF sent to pharmacy on file. Rx for Bi-Est 5 mg 70 to 30 ratio 2-4 clicks daily printed and to Dr.Silva for review.

## 2016-07-21 ENCOUNTER — Other Ambulatory Visit: Payer: Self-pay | Admitting: Obstetrics and Gynecology

## 2016-07-21 NOTE — Telephone Encounter (Signed)
Return call to patient. Requests refill on Progesterone SR 200 mg. Has been taking QHS but has been off for 3-4 weeks since ran out.  Denies bleeding since she has had a hysterectomy.  Advised Dr Quincy Simmonds will review request. We will notify her of response but will be next week due to time of day.  Request rx to [facility]. AEX 09-06-16.

## 2016-07-21 NOTE — Telephone Encounter (Signed)
Patient is requesting a prescription for progesterone be sent to her pharmacy on file. She said she has taken this before but her provider who prescribed her "bioidentical hormones has closed up shop."  Patient rescheduled from [place] schedule for her AEX on 08/25/16 to Dr. Elza Rafter schedule on 09/06/16. FYI only.

## 2016-07-23 MED ORDER — PROGESTERONE MICRONIZED 200 MG PO CAPS: ORAL_CAPSULE | ORAL | 1 refills | Status: DC

## 2016-07-24 ENCOUNTER — Telehealth: Payer: Self-pay | Admitting: *Deleted

## 2016-07-24 NOTE — Telephone Encounter (Signed)
Previous refill for Prometrium canceled at [facility]. Call to patient to discuss refill need. Left message to call back. Ask for [facility]iller or [place].

## 2016-07-25 NOTE — Telephone Encounter (Signed)
Patient returned call

## 2016-07-25 NOTE — Telephone Encounter (Signed)
Left message to call Kaitlyn at [phone]. 

## 2016-08-03 NOTE — Telephone Encounter (Signed)
Spoke with patient. Verified rx she is taking is Progesterone SR 200 mg daily. Requesting refill to [facility]. Also requesting refill of Bi-Est 5 mg 70 to 30 ratio 2-4 clicks daily as this was written in a 1 month supply on 06/22/2016, but the patient had to move her aex out to see Dr.Silva as Kem Boroughs, FNP has retired.

## 2016-08-03 NOTE — Telephone Encounter (Signed)
Ok to refill the Bi-Est and the Prometrium as you have outlined for one month with one month refill, which should provide enough until her annual exam with me. Thank you.

## 2016-08-04 MED ORDER — NONFORMULARY OR COMPOUNDED ITEM: 0 refills | Status: DC

## 2016-08-04 NOTE — Telephone Encounter (Signed)
Prescriptions faxed to [facility] at [phone].  Routing to provider for final review. Patient agreeable to disposition. Will close encounter.

## 2016-08-04 NOTE — Telephone Encounter (Signed)
Prescriptions written and to Dr.Silva for review before fax to [facility].

## 2016-08-11 ENCOUNTER — Other Ambulatory Visit: Payer: Self-pay | Admitting: Obstetrics and Gynecology

## 2016-08-11 NOTE — Telephone Encounter (Signed)
Medication refill request: [place], progesterone  Last AEX:  08/25/15 PG Next AEX: 09/06/16 Dr. Quincy Simmonds  Last MMG (if hormonal medication request): 08/05/15 BIRADS1:neg  Refill authorized: Rx faxed to [facility] 08/04/16 #32month/1R. Enough to last until AEX.

## 2016-08-25 ENCOUNTER — Ambulatory Visit: Payer: 59 | Admitting: Nurse Practitioner

## 2016-09-04 NOTE — Progress Notes (Signed)
61 y.o. [id number] Married Caucasian female here for annual exam.    Taking compounded HRT.  She has enough for now.  Was seeing Sanda Klein, NP to monitor these through Dr. Lelon Huh office.  Working on hair loss issues.  Considering [facility].  She has already seen dermatology and gone to [facility] for this.   Some bloating and change in her stools. Stools are more loose. Doing a lot of fiber and herbal type supplements. No fecal incontinence.   Notes her bladder has dropped.  No leakage of urine, is voiding well, and is not having infections.   Ran out of Liothyronine. Taking only 5 mg daily. Needs refill until sees PCP next week.  PCP:  Briscoe Deutscher, D.O. - establishing care with a new PCP next week.   Patient's last menstrual period was 02/12/2007 (exact date).           Sexually active: Yes.   female The current method of family planning is status post hysterectomy.    Exercising: Yes.    Spin, weights and tennis Smoker:  no  Health Maintenance: Pap: 08-30-15 Neg:Neg HR HPV;08-21-14 ASCUS:Pos HR HPV History of abnormal Pap:  Yes, 08-21-14 ASCUS:Pos HR HPV with colposcopy revealing atypia on exocervical bx and Neg ECC. MMG: 08-05-15 Density B/Neg/BiRads1:TBC Colonoscopy: 01/31/2008 normal with Dr.Stark and repeat in 10 yrs BMD: 10-26-10  Result:Osteopenia TDaP: 04-22-12 Gardasil:   no NWG:NFAO do with PCP Hep C: will do with PCP Screening Labs:  Hb today: PCP, Urine today: not done   reports that she has never smoked. She has never used smokeless tobacco. She reports that she drinks about 4.2 oz of alcohol per week . She reports that she does not use drugs.  Past Medical History:  Diagnosis Date  . Abnormal Pap smear of cervix 07-31-13   --pt. 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 Allergy   . Osteopenia     Past Surgical History:  Procedure Laterality Date  . PILONIDAL CYST DRAINAGE  age 65  . ROBOTIC ASSISTED SUPRACERVICAL HYSTERECTOMY   02/12/07   Adenomyosis and fibroids; Ovaries remain; [place]  . TONSILLECTOMY AND ADENOIDECTOMY  child    Current Outpatient Prescriptions  Medication Sig Dispense Refill  . calcium carbonate (OS-CAL) 600 MG TABS Take 600 mg by mouth 2 (two) times daily with a meal.      . fish oil-omega-3 fatty acids 1000 MG capsule Take 2 g by mouth daily.    Marland Kitchen liothyronine (CYTOMEL) 5 MCG tablet Take 10 mcg by mouth daily.      Marland Kitchen LORazepam (ATIVAN) 0.5 MG tablet TAKE 1 TABLET BY MOUTH EVERY DAY AS NEEDED 30 tablet 1  . Multiple Vitamin (MULTIVITAMIN) tablet Take 1 tablet by mouth daily.    . NONFORMULARY OR COMPOUNDED ITEM Bi-Est 5 mg 70 to 30 ratio 2-4 clicks daily. Dispense 1 month supply 0RF. 1 each 0  . NONFORMULARY OR COMPOUNDED ITEM Progesterone SR 200 mg take 1 tablet daily. 30 each 0  . SYNTHROID 75 MCG tablet Take 1 tablet (75 mcg total) by mouth daily. 30 tablet 0   No current facility-administered medications for this visit.     Family History  Problem Relation Age of Onset  . Hypertension Mother   . Osteoporosis Mother   . Hyperlipidemia Father   . Cancer Father        non hodgkins lymphoma  . Hypertension Maternal Grandmother   . Stroke Maternal Grandmother   . Osteoporosis  Unknown        fhx    ROS:  Pertinent items are noted in HPI.  Otherwise, a comprehensive ROS was negative.  Exam:   BP 110/68 (BP Location: Right Arm, Patient Position: Sitting, Cuff Size: Small)   Pulse 76   Resp 14   Ht 5' 1.5" (1.562 m)   Wt 114 lb 6.4 oz (51.9 kg)   LMP 02/12/2007 (Exact Date)   BMI 21.27 kg/m     General appearance: alert, cooperative and appears stated age Head: Normocephalic, without obvious abnormality, atraumatic Neck: no adenopathy, supple, symmetrical, trachea midline and thyroid normal to inspection and palpation Lungs: clear to auscultation bilaterally Breasts: normal appearance, no masses or tenderness, No nipple retraction or dimpling, No nipple discharge or  bleeding, No axillary or supraclavicular adenopathy Heart: regular rate and rhythm Abdomen: soft, non-tender; no masses, no organomegaly Extremities: extremities normal, atraumatic, no cyanosis or edema Skin: Skin color, texture, turgor normal. No rashes or lesions Lymph nodes: Cervical, supraclavicular, and axillary nodes normal. No abnormal inguinal nodes palpated Neurologic: Grossly normal  Pelvic: External genitalia:  no lesions              Urethra:  normal appearing urethra with no masses, tenderness or lesions              Bartholins and Skenes: normal                 Vagina: normal appearing vagina with normal color and discharge, no lesions              Cervix: no lesions              Pap taken: Yes.   Bimanual Exam:  Uterus:  normal size, contour, position, consistency, mobility, non-tender              Adnexa: no mass, fullness, tenderness              Rectal exam: Yes.  .  Confirms.              Anus:  normal sphincter tone, no lesions  Chaperone was present for exam.  Assessment:   Well woman visit with normal exam. On bioidentical HRT. Hx positive HR HPV and LGSIL. Ran out of Cytomel.  Osteopenia.   Plan: Mammogram screening discussed.  She  Recommended self breast awareness. Pap and HR HPV as above. Guidelines for Calcium, Vitamin D, regular exercise program including cardiovascular and weight bearing exercise. IFOB card to patient.  Refill of Cytomel 5 mg x 1 month.  She will pursue hormonal tx through [facility] potentially.  We viewed the WHI and risks of HRT. I encouraged BMD through [place].  Follow up annually and prn.    After visit summary provided.

## 2016-09-06 ENCOUNTER — Other Ambulatory Visit (HOSPITAL_COMMUNITY)
Admission: RE | Admit: 2016-09-06 | Discharge: 2016-09-06 | Disposition: A | Discharge status: Home / Self Care | Payer: 59 | Source: Ambulatory Visit | Attending: Obstetrics and Gynecology | Admitting: Obstetrics and Gynecology

## 2016-09-06 ENCOUNTER — Encounter: Payer: Self-pay | Admitting: Obstetrics and Gynecology

## 2016-09-06 ENCOUNTER — Ambulatory Visit (INDEPENDENT_AMBULATORY_CARE_PROVIDER_SITE_OTHER): Payer: 59 | Admitting: Obstetrics and Gynecology

## 2016-09-06 VITALS — BP 110/68 | HR 76 | Resp 14 | Ht 61.5 in | Wt 114.4 lb

## 2016-09-06 DIAGNOSIS — Z1211 Encounter for screening for malignant neoplasm of colon: Secondary | ICD-10-CM

## 2016-09-06 DIAGNOSIS — Z01419 Encounter for gynecological examination (general) (routine) without abnormal findings: Secondary | ICD-10-CM | POA: Diagnosis not present

## 2016-09-06 MED ORDER — LIOTHYRONINE SODIUM 5 MCG PO TABS: 5.0000 ug | ORAL_TABLET | Freq: Every day | ORAL | 0 refills | Status: DC

## 2016-09-06 NOTE — Patient Instructions (Signed)

## 2016-09-07 LAB — CYTOLOGY - PAP
DIAGNOSIS: NEGATIVE
HPV (WINDOPATH): NOT DETECTED

## 2016-09-12 ENCOUNTER — Ambulatory Visit (INDEPENDENT_AMBULATORY_CARE_PROVIDER_SITE_OTHER): Payer: 59 | Admitting: Family Medicine

## 2016-09-12 ENCOUNTER — Encounter: Payer: Self-pay | Admitting: Family Medicine

## 2016-09-12 VITALS — BP 132/88 | HR 73 | Temp 98.5°F | Ht 61.5 in | Wt 115.4 lb

## 2016-09-12 DIAGNOSIS — L659 Nonscarring hair loss, unspecified: Secondary | ICD-10-CM

## 2016-09-12 DIAGNOSIS — E039 Hypothyroidism, unspecified: Secondary | ICD-10-CM | POA: Diagnosis not present

## 2016-09-12 MED ORDER — SYNTHROID 75 MCG PO TABS: 75.0000 ug | ORAL_TABLET | Freq: Every day | ORAL | 1 refills | Status: DC

## 2016-09-12 MED ORDER — LIOTHYRONINE SODIUM 5 MCG PO TABS: 5.0000 ug | ORAL_TABLET | Freq: Every day | ORAL | 1 refills | Status: DC

## 2016-09-12 NOTE — Progress Notes (Signed)
Angela [name] is a 61 y.o. female is here to [facility].   Patient Care Team: Briscoe Deutscher, DO as PCP - General (Family Medicine)   History of Present Illness:   HPI:  Angela [name] is a 61 y.o. female  is seen for hypothyroidism. Current symptoms: none. Patient denies change in energy level, diarrhea, heat / cold intolerance, nervousness, palpitations and weight changes.   She has a long history of hair loss. She has seen a few specialists and was most recently followed by [facility]. Her labs and medications were reviewed together today.  Previously followed by Sanda Klein, NP and Dr. Lorraine Lax.  Health Maintenance Due  Topic Date Due  . Hepatitis C Screening  1955-03-02  . HIV Screening  12/25/1971  . INFLUENZA VACCINE  08/16/2016   Depression screen PHQ 2/9 09/12/2016  Decreased Interest 0  Down, Depressed, Hopeless 0  PHQ - 2 Score 0   PMHx, SurgHx, SocialHx, Medications, and Allergies were reviewed in the Visit Navigator and updated as appropriate.   Past Medical History:  Diagnosis Date  . Abnormal Pap smear of cervix 07-31-13   --pt. 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 Allergy   . Arthritis   . Chicken pox   . Heart murmur   . Osteopenia   . Thyroid disease    Past Surgical History:  Procedure Laterality Date  . ABDOMINAL HYSTERECTOMY    . PILONIDAL CYST DRAINAGE  age 56  . ROBOTIC ASSISTED SUPRACERVICAL HYSTERECTOMY  02/12/07   Adenomyosis and fibroids; Ovaries remain; [place]  . TONSILLECTOMY AND ADENOIDECTOMY  child   Family History  Problem Relation Age of Onset  . Hypertension Mother   . Osteoporosis Mother   . Hyperlipidemia Father   . Non-Hodgkin's lymphoma Father   . Hypertension Maternal Grandmother   . Stroke Maternal Grandmother   . Osteoporosis Unknown    Social History  Substance Use Topics  . Smoking status: Never Smoker  . Smokeless tobacco: Never Used  . Alcohol use  4.2 oz/week    7 Glasses of wine per week   Current Medications and Allergies:   .  calcium carbonate (OS-CAL) 600 MG TABS, Take 600 mg by mouth 2 (two) times daily with a meal.  , Disp: , Rfl:  .  fish oil-omega-3 fatty acids 1000 MG capsule, Take 2 g by mouth daily., Disp: , Rfl:  .  liothyronine (CYTOMEL) 5 MCG tablet, Take 1 tablet (5 mcg total) by mouth daily., Disp: 30 tablet, Rfl: 1 .  Multiple Vitamin (MULTIVITAMIN) tablet, Take 1 tablet by mouth daily., Disp: , Rfl:  .  NONFORMULARY OR COMPOUNDED ITEM, Bi-Est 5 mg 70 to 30 ratio 2-4 clicks daily. Dispense 1 month supply 0RF., Disp: 1 each, Rfl: 0 .  NONFORMULARY OR COMPOUNDED ITEM, Progesterone SR 200 mg take 1 tablet daily., Disp: 30 each, Rfl: 0 .  SYNTHROID 75 MCG tablet, Take 1 tablet (75 mcg total) by mouth daily., Disp: 30 tablet, Rfl: 1  No Known Allergies   Review of Systems:   Pertinent items are noted in the HPI. Otherwise, ROS is negative.  Vitals:   Vitals:   09/12/16 1402  BP: 132/88  Pulse: 73  Temp: 98.5 F (36.9 C)  TempSrc: Oral  SpO2: 97%  Weight: 115 lb 6.4 oz (52.3 kg)  Height: 5' 1.5" (1.562 m)     Body mass index is 21.45 kg/m.   Physical Exam:   Physical Exam  Constitutional: She appears well-nourished.  HENT:  Head: Normocephalic and atraumatic.  Eyes: Pupils are equal, round, and reactive to light. EOM are normal.  Neck: Normal range of motion. Neck supple.  Cardiovascular: Normal rate, regular rhythm, normal heart sounds and intact distal pulses.   Pulmonary/Chest: Effort normal.  Abdominal: Soft.  Skin: Skin is warm.  Psychiatric: She has a normal mood and affect. Her behavior is normal.  Nursing note and vitals reviewed.  Results for orders placed or performed in visit on 09/12/16  T4, free  Result Value Ref Range   Free T4 0.84 0.60 - 1.60 ng/dL  TSH  Result Value Ref Range   TSH 1.02 0.35 - 4.50 uIU/mL  T3, free  Result Value Ref Range   T3, Free 2.7 2.3 - 4.2 pg/mL    Assessment and Plan:   Angela [name] was seen today for establish care.  Diagnoses and all orders for this visit:  Hypothyroidism, unspecified type Comments: Okay refill this visit, but will send to [facility]. I informed her that I do not practice this type of care.  Orders: -     SYNTHROID 75 MCG tablet; Take 1 tablet (75 mcg total) by mouth daily. -     T4, free -     TSH -     T3, free -     T3, reverse -     liothyronine (CYTOMEL) 5 MCG tablet; Take 1 tablet (5 mcg total) by mouth daily.  Hair loss Comments: I provided the number for [facility], known for work with patients with hair loss.  . Reviewed expectations re: course of current medical issues. . Discussed self-management of symptoms. . Outlined signs and symptoms indicating need for more acute intervention. . Patient verbalized understanding and all questions were answered. Marland Kitchen Health Maintenance issues including appropriate healthy diet, exercise, and smoking avoidance were discussed with patient. . See orders for this visit as documented in the electronic medical record. . Patient received an After Visit Summary.   Briscoe Deutscher, DO [place], [place] 09/14/2016  Future Appointments Date Time Provider [place]  09/14/2017 8:30 AM Nunzio Cobbs, MD [place] None

## 2016-09-13 ENCOUNTER — Encounter: Payer: Self-pay | Admitting: Family Medicine

## 2016-09-13 ENCOUNTER — Other Ambulatory Visit: Payer: Self-pay | Admitting: Surgical

## 2016-09-13 LAB — T4, FREE: Free T4: 0.84 ng/dL (ref 0.60–1.60)

## 2016-09-13 LAB — TSH: TSH: 1.02 u[IU]/mL (ref 0.35–4.50)

## 2016-09-13 LAB — T3, FREE: T3, Free: 2.7 pg/mL (ref 2.3–4.2)

## 2016-09-13 MED ORDER — LIOTHYRONINE SODIUM 5 MCG PO TABS: 5.0000 ug | ORAL_TABLET | Freq: Every day | ORAL | 1 refills | Status: DC

## 2016-09-15 ENCOUNTER — Telehealth: Payer: Self-pay | Admitting: *Deleted

## 2016-09-15 NOTE — Telephone Encounter (Signed)
Please advise 

## 2016-09-15 NOTE — Telephone Encounter (Signed)
Patient requesting refill on:  -Estradiol cream -Progesterone capules -- Qty: 30   Pharmacy: [facility]

## 2016-09-16 LAB — T3, REVERSE: T3, Reverse: 12 ng/dL (ref 8–25)

## 2016-09-18 ENCOUNTER — Telehealth: Payer: Self-pay | Admitting: Obstetrics and Gynecology

## 2016-09-18 NOTE — Telephone Encounter (Signed)
Please remind patient to return her IFOB if this was not already done.  This came up in my reminder box in EPIC.

## 2016-09-19 NOTE — Telephone Encounter (Signed)
Call pharmacy today so that patient can get the medication today if possible.

## 2016-09-19 NOTE — Telephone Encounter (Signed)
Will need to call pharmacy for full prescription. Okay to refill x 2 months while patient is getting into [facility].

## 2016-09-19 NOTE — Telephone Encounter (Signed)
Patient returned call. Patient states she has been out of town and busy and had "forgotten about IFOB." Patient states she will do it this week and get it sent back to the office.   Routing to provider for final review. Patient agreeable to disposition. Will close encounter.

## 2016-09-19 NOTE — Telephone Encounter (Signed)
Detailed message left per DPR for patient to return call to office to update if she plans to return IFOB.

## 2016-09-20 NOTE — Telephone Encounter (Signed)
Refill called in to [facility].  They will call patient when prescriptions are ready.

## 2016-10-09 DIAGNOSIS — M9903 Segmental and somatic dysfunction of lumbar region: Secondary | ICD-10-CM | POA: Diagnosis not present

## 2016-10-09 DIAGNOSIS — M62830 Muscle spasm of back: Secondary | ICD-10-CM | POA: Diagnosis not present

## 2016-10-09 DIAGNOSIS — M545 Low back pain: Secondary | ICD-10-CM | POA: Diagnosis not present

## 2016-10-11 DIAGNOSIS — M545 Low back pain: Secondary | ICD-10-CM | POA: Diagnosis not present

## 2016-10-11 DIAGNOSIS — M9903 Segmental and somatic dysfunction of lumbar region: Secondary | ICD-10-CM | POA: Diagnosis not present

## 2016-10-11 DIAGNOSIS — M62830 Muscle spasm of back: Secondary | ICD-10-CM | POA: Diagnosis not present

## 2016-10-31 ENCOUNTER — Ambulatory Visit (INDEPENDENT_AMBULATORY_CARE_PROVIDER_SITE_OTHER): Payer: 59

## 2016-10-31 DIAGNOSIS — Z23 Encounter for immunization: Secondary | ICD-10-CM | POA: Diagnosis not present

## 2016-11-06 DIAGNOSIS — I8312 Varicose veins of left lower extremity with inflammation: Secondary | ICD-10-CM | POA: Diagnosis not present

## 2016-11-06 DIAGNOSIS — I8311 Varicose veins of right lower extremity with inflammation: Secondary | ICD-10-CM | POA: Diagnosis not present

## 2016-11-08 DIAGNOSIS — H5203 Hypermetropia, bilateral: Secondary | ICD-10-CM | POA: Diagnosis not present

## 2016-11-29 DIAGNOSIS — M62830 Muscle spasm of back: Secondary | ICD-10-CM | POA: Diagnosis not present

## 2016-11-29 DIAGNOSIS — M545 Low back pain: Secondary | ICD-10-CM | POA: Diagnosis not present

## 2016-11-29 DIAGNOSIS — M9903 Segmental and somatic dysfunction of lumbar region: Secondary | ICD-10-CM | POA: Diagnosis not present

## 2016-12-20 DIAGNOSIS — I8312 Varicose veins of left lower extremity with inflammation: Secondary | ICD-10-CM | POA: Diagnosis not present

## 2017-01-22 DIAGNOSIS — M62830 Muscle spasm of back: Secondary | ICD-10-CM | POA: Diagnosis not present

## 2017-01-22 DIAGNOSIS — M9903 Segmental and somatic dysfunction of lumbar region: Secondary | ICD-10-CM | POA: Diagnosis not present

## 2017-01-22 DIAGNOSIS — M545 Low back pain: Secondary | ICD-10-CM | POA: Diagnosis not present

## 2017-01-31 ENCOUNTER — Encounter: Payer: Self-pay | Admitting: Family Medicine

## 2017-01-31 DIAGNOSIS — R5383 Other fatigue: Secondary | ICD-10-CM | POA: Diagnosis not present

## 2017-01-31 DIAGNOSIS — L650 Telogen effluvium: Secondary | ICD-10-CM | POA: Diagnosis not present

## 2017-01-31 DIAGNOSIS — E781 Pure hyperglyceridemia: Secondary | ICD-10-CM | POA: Diagnosis not present

## 2017-01-31 DIAGNOSIS — E039 Hypothyroidism, unspecified: Secondary | ICD-10-CM | POA: Diagnosis not present

## 2017-01-31 DIAGNOSIS — N951 Menopausal and female climacteric states: Secondary | ICD-10-CM | POA: Diagnosis not present

## 2017-02-08 LAB — CBC AND DIFFERENTIAL
HEMATOCRIT: 40 (ref 36–46)
Hemoglobin: 13.1 (ref 12.0–16.0)
NEUTROS ABS: 2
PLATELETS: 243 (ref 150–399)
WBC: 3.4

## 2017-02-08 LAB — BASIC METABOLIC PANEL
CREATININE: 0.7 (ref 0.5–1.1)
GLUCOSE: 84
Potassium: 4.4 (ref 3.4–5.3)
Sodium: 141 (ref 137–147)

## 2017-02-08 LAB — HEMOGLOBIN A1C: HEMOGLOBIN A1C: 5.3

## 2017-02-08 LAB — LIPID PANEL
Cholesterol: 234 — AB (ref 0–200)
HDL: 50 (ref 35–70)
LDL CALC: 113
TRIGLYCERIDES: 81 (ref 40–160)

## 2017-02-08 LAB — HEPATIC FUNCTION PANEL: Alkaline Phosphatase: 64 (ref 25–125)

## 2017-03-05 ENCOUNTER — Other Ambulatory Visit: Payer: Self-pay | Admitting: Family Medicine

## 2017-03-05 DIAGNOSIS — Z1231 Encounter for screening mammogram for malignant neoplasm of breast: Secondary | ICD-10-CM

## 2017-03-19 DIAGNOSIS — M62830 Muscle spasm of back: Secondary | ICD-10-CM | POA: Diagnosis not present

## 2017-03-19 DIAGNOSIS — M9903 Segmental and somatic dysfunction of lumbar region: Secondary | ICD-10-CM | POA: Diagnosis not present

## 2017-03-19 DIAGNOSIS — M545 Low back pain: Secondary | ICD-10-CM | POA: Diagnosis not present

## 2017-03-22 ENCOUNTER — Ambulatory Visit
Admission: RE | Admit: 2017-03-22 | Discharge: 2017-03-22 | Disposition: A | Discharge status: Home / Self Care | Payer: 59 | Source: Ambulatory Visit | Attending: Family Medicine | Admitting: Family Medicine

## 2017-03-22 DIAGNOSIS — Z1231 Encounter for screening mammogram for malignant neoplasm of breast: Secondary | ICD-10-CM | POA: Diagnosis not present

## 2017-04-09 ENCOUNTER — Other Ambulatory Visit: Payer: Self-pay | Admitting: Family Medicine

## 2017-04-09 NOTE — Telephone Encounter (Signed)
Please advise on refill. Patient has not been seen since 08/2016. I tried to call to schedule appointment. No answer.

## 2017-04-20 ENCOUNTER — Encounter: Payer: 59 | Admitting: Family Medicine

## 2017-04-23 DIAGNOSIS — M9903 Segmental and somatic dysfunction of lumbar region: Secondary | ICD-10-CM | POA: Diagnosis not present

## 2017-04-23 DIAGNOSIS — M62830 Muscle spasm of back: Secondary | ICD-10-CM | POA: Diagnosis not present

## 2017-04-23 DIAGNOSIS — M545 Low back pain: Secondary | ICD-10-CM | POA: Diagnosis not present

## 2017-04-24 ENCOUNTER — Encounter: Payer: Self-pay | Admitting: Family Medicine

## 2017-04-24 ENCOUNTER — Ambulatory Visit (INDEPENDENT_AMBULATORY_CARE_PROVIDER_SITE_OTHER): Payer: 59 | Admitting: Family Medicine

## 2017-04-24 VITALS — BP 130/84 | HR 72 | Temp 99.0°F | Ht 61.5 in | Wt 117.2 lb

## 2017-04-24 DIAGNOSIS — Z114 Encounter for screening for human immunodeficiency virus [HIV]: Secondary | ICD-10-CM

## 2017-04-24 DIAGNOSIS — R5383 Other fatigue: Secondary | ICD-10-CM

## 2017-04-24 DIAGNOSIS — Z0000 Encounter for general adult medical examination without abnormal findings: Secondary | ICD-10-CM | POA: Diagnosis not present

## 2017-04-24 DIAGNOSIS — Z13220 Encounter for screening for lipoid disorders: Secondary | ICD-10-CM | POA: Diagnosis not present

## 2017-04-24 DIAGNOSIS — M8580 Other specified disorders of bone density and structure, unspecified site: Secondary | ICD-10-CM | POA: Diagnosis not present

## 2017-04-24 NOTE — Progress Notes (Addendum)
Subjective:    Angela [name] is a 62 y.o. female and is here for a comprehensive physical exam.  There are no preventive care reminders to display for this patient.    PMHx, SurgHx, SocialHx, Medications, and Allergies were reviewed in the Visit Navigator and updated as appropriate.   Past Medical History:  Diagnosis Date  . Abnormal Pap smear of cervix 07-31-13   --pt. 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 Allergy   . Arthritis   . Chicken pox   . Heart murmur   . Osteopenia   . Thyroid disease     Past Surgical History:  Procedure Laterality Date  . ABDOMINAL HYSTERECTOMY    . PILONIDAL CYST DRAINAGE  age 69  . ROBOTIC ASSISTED SUPRACERVICAL HYSTERECTOMY  02/12/07   Adenomyosis and fibroids; Ovaries remain; [place]  . TONSILLECTOMY AND ADENOIDECTOMY  child      Family History  Problem Relation Age of Onset  . Hypertension Mother   . Osteoporosis Mother   . Hyperlipidemia Father   . Non-Hodgkin's lymphoma Father   . Hypertension Maternal Grandmother   . Stroke Maternal Grandmother   . Osteoporosis Unknown   Social History   Tobacco Use  . Smoking status: Never Smoker  . Smokeless tobacco: Never Used  Substance Use Topics  . Alcohol use: Yes    Alcohol/week: 4.2 oz    Types: 7 Glasses of wine per week  . Drug use: No  s; Ovaries remain; [place]  . TONSILLECTOMY AND ADENOIDECTOMY  child  D ADENOIDECTOMY     chBP 130/84   Pulse 72   Temp 99 F (37.2 C) (Oral)   Ht 5' 1.5" (1.562 m)   Wt 117 lb 3.2 oz (53.2 kg)   LMP 02/12/2007 (Exact Date)   SpO2 97%   BMI 21.79 kg/m  nd Wt Readings from Last 3 Encounters:  04/24/17 117 lb 3.2 oz (53.2 kg)  09/12/16 115 lb 6.4 oz (52.3 kg)  09/06/16  114 lb 6.4 oz (51.9 kg)    Ht Readings from Last 3 Encounters:  04/24/17 5' 1.5" (1.562 m)  09/12/16 5' 1.5" (1.562 m)  09/06/16 5' 1.5" (1.562 m)  . Drug use: No   Review of Systems:   Pertinent  items are noted in the HPI. Otherwise, ROS is negative.  Objective:   BP 130/84   Pulse 72   Temp 99 F (37.2 C) (Oral)   Ht 5' 1.5" (1.562 m)   Wt 117 lb 3.2 oz (53.2 kg)   LMP 02/12/2007 (Exact Date)   SpO2 97%   BMI 21.79 kg/m     Wt Readings from Last 3 Encounters:  04/24/17 117 lb 3.2 oz (53.2 kg)  09/12/16 115 lb 6.4 oz (52.3 kg)  09/06/16 114 lb 6.4 oz (51.9 kg)     Ht Readings from Last 3 Encounters:  04/24/17 5' 1.5" (1.562 m)  09/12/16 5' 1.5" (1.562 m)  09/06/16 5' 1.5" (1.562 m)   General appearance: alert, cooperative and appears stated age. Head: normocephalic. Lungs: clear to auscultation bilaterally. Heart: regular rate and rhythm Abdomen: soft, non-tender; no masses,  no organomegaly. Extremities: extremities normal, atraumatic, no cyanosis or edema. Skin: skin color, texture, turgor normal, no rashes or lesions. Lymph: cervical, supraclavicular, and axillary nodes normal; no abnormal inguinal nodes palpated. Neurologic: grossly normal.  Assessment/Plan:   Pammie was seen today for annual exam.  Diagnoses and all orders for this visit:  Routine  physical examination  Screening for HIV (human immunodeficiency virus)  Screening for lipid disorders  Osteopenia, unspecified location  Orders Placed This Encounter  Procedures  . CBC and differential  . Basic metabolic panel  . Lipid panel  . Hepatic function panel  . Hemoglobin A1c   Patient Counseling: [x]    Nutrition: Stressed importance of moderation in sodium/caffeine intake, saturated fat and cholesterol, caloric balance, sufficient intake of fresh fruits, vegetables, fiber, calcium, iron, and 1 mg of folate supplement per day (for females capable of pregnancy).  [x]    Stressed the importance of regular exercise.   [x]    Substance Abuse: Discussed cessation/primary prevention of tobacco, alcohol, or other drug use; driving or other dangerous activities under the influence; availability of  treatment for abuse.   [x]    Injury prevention: Discussed safety belts, safety helmets, smoke detector, smoking near bedding or upholstery.   [x]    Sexuality: Discussed sexually transmitted diseases, partner selection, use of condoms, avoidance of unintended pregnancy  and contraceptive alternatives.  [x]    Dental health: Discussed importance of regular tooth brushing, flossing, and dental visits.  [x]    Health maintenance and immunizations reviewed. Please refer to Health maintenance section.   Briscoe Deutscher, DO [place]

## 2017-04-25 ENCOUNTER — Encounter: Payer: Self-pay | Admitting: Family Medicine

## 2017-05-01 ENCOUNTER — Ambulatory Visit (INDEPENDENT_AMBULATORY_CARE_PROVIDER_SITE_OTHER): Payer: 59 | Admitting: Family Medicine

## 2017-05-01 VITALS — BP 128/82 | HR 68 | Temp 99.1°F | Ht 61.5 in | Wt 116.8 lb

## 2017-05-01 DIAGNOSIS — R109 Unspecified abdominal pain: Secondary | ICD-10-CM

## 2017-05-01 DIAGNOSIS — H6122 Impacted cerumen, left ear: Secondary | ICD-10-CM

## 2017-05-01 DIAGNOSIS — Z1211 Encounter for screening for malignant neoplasm of colon: Secondary | ICD-10-CM

## 2017-05-01 LAB — POC URINALSYSI DIPSTICK (AUTOMATED)
Bilirubin, UA: NEGATIVE
Blood, UA: NEGATIVE
Glucose, UA: NEGATIVE
Ketones, UA: NEGATIVE
Leukocytes, UA: NEGATIVE
Nitrite, UA: NEGATIVE
Protein, UA: NEGATIVE
Spec Grav, UA: 1.015 (ref 1.010–1.025)
Urobilinogen, UA: 0.2 E.U./dL
pH, UA: 6 (ref 5.0–8.0)

## 2017-05-01 NOTE — Addendum Note (Signed)
Addended by: Francella Solian on: 05/01/2017 04:23 PM   Modules accepted: Orders

## 2017-05-01 NOTE — Progress Notes (Signed)
   Angela [name] is a 62 y.o. female here for an acute visit.  Ear lavage attempted on left without relief. Too painful. Will send to ENT. Briscoe Deutscher

## 2017-05-28 DIAGNOSIS — H6123 Impacted cerumen, bilateral: Secondary | ICD-10-CM | POA: Diagnosis not present

## 2017-06-20 DIAGNOSIS — D485 Neoplasm of uncertain behavior of skin: Secondary | ICD-10-CM | POA: Diagnosis not present

## 2017-06-20 DIAGNOSIS — L738 Other specified follicular disorders: Secondary | ICD-10-CM | POA: Diagnosis not present

## 2017-07-17 DIAGNOSIS — Z1212 Encounter for screening for malignant neoplasm of rectum: Secondary | ICD-10-CM | POA: Diagnosis not present

## 2017-07-17 DIAGNOSIS — Z1211 Encounter for screening for malignant neoplasm of colon: Secondary | ICD-10-CM | POA: Diagnosis not present

## 2017-07-18 DIAGNOSIS — E509 Vitamin A deficiency, unspecified: Secondary | ICD-10-CM | POA: Diagnosis not present

## 2017-07-18 DIAGNOSIS — N951 Menopausal and female climacteric states: Secondary | ICD-10-CM | POA: Diagnosis not present

## 2017-07-18 DIAGNOSIS — E7212 Methylenetetrahydrofolate reductase deficiency: Secondary | ICD-10-CM | POA: Diagnosis not present

## 2017-07-26 LAB — COLOGUARD

## 2017-08-07 ENCOUNTER — Telehealth: Payer: Self-pay

## 2017-08-07 NOTE — Telephone Encounter (Signed)
Called cologuard and let them know results not received. She gave verbal of negative she will refax.   Copied from [place] [id number]. Topic: Inquiry >> Aug 06, 2017  3:39 PM Nils Flack wrote: Reason for CRM: pt would like to have the results of her cologuard test. She talked to [facility] and was told that it was faxed to the office on 07/26/17 Please call [phone]

## 2017-08-08 NOTE — Telephone Encounter (Signed)
Patient informed results. No questions at this time.

## 2017-08-13 NOTE — Progress Notes (Signed)
[name] [address] [place], [place] [address] Phone: [phone] Subjective:    CC: right elbow pain   ZWC:HENIDPOEUM  Angela [name] is a 62 y.o. female coming in with complaint of right elbow pain. Has been going on for 6 months. Pain in fossa. Does play tennis and notices pain more with slicing motion. Is right handed. Pain can come up into the tricep. Has had epicondyle pain before but this pain is different. Does do some strength training as well. Has tried ice and IBU to alleviate pain.  Patient does not remember a specific injury.     Past Medical History:  Diagnosis Date  . Abnormal Pap smear of cervix 07-31-13   --pt. 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 Allergy   . Arthritis   . Chicken pox   . Heart murmur   . Osteopenia   . Thyroid disease    Past Surgical History:  Procedure Laterality Date  . ABDOMINAL HYSTERECTOMY    . PILONIDAL CYST DRAINAGE  age 56  . ROBOTIC ASSISTED SUPRACERVICAL HYSTERECTOMY  02/12/07   Adenomyosis and fibroids; Ovaries remain; [place]  . TONSILLECTOMY AND ADENOIDECTOMY  child   Social History   Socioeconomic History  . Marital status: Married    Spouse name: Not on file  . Number of children: Not on file  . Years of education: Not on file  . Highest education level: Not on file  Occupational History  . Not on file  Social Needs  . Financial resource strain: Not on file  . Food insecurity:    Worry: Not on file    Inability: Not on file  . Transportation needs:    Medical: Not on file    Non-medical: Not on file  Tobacco Use  . Smoking status: Never Smoker  . Smokeless tobacco: Never Used  Substance and Sexual Activity  . Alcohol use: Yes    Alcohol/week: 4.2 oz    Types: 7 Glasses of wine per week  . Drug use: No  . Sexual activity: Yes    Partners: Male    Birth control/protection: Surgical    Comment: hysterectomy  Lifestyle  .  Physical activity:    Days per week: Not on file    Minutes per session: Not on file  . Stress: Not on file  Relationships  . Social connections:    Talks on phone: Not on file    Gets together: Not on file    Attends religious service: Not on file    Active member of club or organization: Not on file    Attends meetings of clubs or organizations: Not on file    Relationship status: Not on file  Other Topics Concern  . Not on file  Social History Narrative  . Not on file   No Known Allergies Family History  Problem Relation Age of Onset  . Hypertension Mother   . Osteoporosis Mother   . Hyperlipidemia Father   . Non-Hodgkin's lymphoma Father   . Hypertension Maternal Grandmother   . Stroke Maternal Grandmother   . Osteoporosis Unknown      Past medical history, social, surgical and family history all reviewed in electronic medical record.  No pertanent information unless stated regarding to the chief complaint.   Review of Systems:Review of systems updated and as accurate as of 08/15/17  No headache, visual changes, nausea, vomiting, diarrhea, constipation, dizziness, abdominal pain, skin rash, fevers, chills,  night sweats, weight loss, swollen lymph nodes, body aches, joint swelling,  chest pain, shortness of breath, mood changes.  Positive muscle aches  Objective  Blood pressure 110/68, pulse 70, height 5' 1.5" (1.562 m), weight 116 lb (52.6 kg), last menstrual period 02/12/2007, SpO2 98 %. Systems examined below as of 08/15/17   General: No apparent distress alert and oriented x3 mood and affect normal, dressed appropriately.  HEENT: Pupils equal, extraocular movements intact  Respiratory: Patient's speak in full sentences and does not appear short of breath  Cardiovascular: No lower extremity edema, non tender, no erythema  Skin: Warm dry intact with no signs of infection or rash on extremities or on axial skeleton.  Abdomen: Soft nontender  Neuro: Cranial nerves II  through XII are intact, neurovascularly intact in all extremities with 2+ DTRs and 2+ pulses.  Lymph: No lymphadenopathy of posterior or anterior cervical chain or axillae bilaterally.  Gait normal with good balance and coordination.  MSK:  Non tender with full range of motion and good stability and symmetric strength and tone of shoulders,  wrist, hip, knee and ankles bilaterally.   Elbow: Right elbow Unremarkable to inspection.  Patient does have some swelling over the palmar aspect of the radial head Range of motion is lacking last 5 degrees of supination Stable to varus, valgus stress. Negative moving valgus stress test. Mild discomfort over the radial Ulnar nerve does not sublux. Negative cubital tunnel Tinel's. Contralateral elbow unremarkable  Musculoskeletal ultrasound was performed and interpreted by Charlann Boxer D.O.   Elbow: Right elbow Lateral epicondyle and common extensor tendon origin visualized.  No edema, effusions, or avulsions seen.  Radial head has more of the stented and with possible callus formation.  Significant increase in Doppler flow in the surrounding area but no true masses appreciated. Medial epicondyle and common flexor tendon origin visualized.  No edema, effusions, or avulsions seen. Ulnar nerve in cubital tunnel unremarkable. Olecranon and triceps insertion visualized and unremarkable without edema, effusion, or avulsion.  No signs olecranon bursitis. Power doppler signal normal.  IMPRESSION: Abnormality of the radial head with some atypical vascularity  [id number]; 15 additional minutes spent for Therapeutic exercises as stated in above notes.  This included exercises focusing on stretching, strengthening, with significant focus on eccentric aspects.   Long term goals include an improvement in range of motion, strength, endurance as well as avoiding reinjury. Patient's frequency would include in 1-2 times a day, 3-5 times a week for a duration of 6-12 weeks.   Proper technique shown and discussed handout in great detail with ATC.  All questions were discussed and answered.      Impression and Recommendations:     This case required medical decision making of moderate complexity.      Note: This dictation was prepared with Dragon dictation along with smaller phrase technology. Any transcriptional errors that result from this process are unintentional.

## 2017-08-15 ENCOUNTER — Ambulatory Visit: Payer: Self-pay

## 2017-08-15 ENCOUNTER — Ambulatory Visit (INDEPENDENT_AMBULATORY_CARE_PROVIDER_SITE_OTHER)
Admission: RE | Admit: 2017-08-15 | Discharge: 2017-08-15 | Disposition: A | Discharge status: Home / Self Care | Payer: 59 | Source: Ambulatory Visit | Attending: Family Medicine | Admitting: Family Medicine

## 2017-08-15 ENCOUNTER — Encounter: Payer: Self-pay | Admitting: Family Medicine

## 2017-08-15 ENCOUNTER — Ambulatory Visit (INDEPENDENT_AMBULATORY_CARE_PROVIDER_SITE_OTHER): Payer: 59 | Admitting: Family Medicine

## 2017-08-15 VITALS — BP 110/68 | HR 70 | Ht 61.5 in | Wt 116.0 lb

## 2017-08-15 DIAGNOSIS — S52124A Nondisplaced fracture of head of right radius, initial encounter for closed fracture: Secondary | ICD-10-CM | POA: Diagnosis not present

## 2017-08-15 DIAGNOSIS — S52123A Displaced fracture of head of unspecified radius, initial encounter for closed fracture: Secondary | ICD-10-CM | POA: Insufficient documentation

## 2017-08-15 DIAGNOSIS — M25521 Pain in right elbow: Secondary | ICD-10-CM

## 2017-08-15 HISTORY — DX: Displaced fracture of head of unspecified radius, initial encounter for closed fracture: S52.123A

## 2017-08-15 MED ORDER — VITAMIN D (ERGOCALCIFEROL) 1.25 MG (50000 UNIT) PO CAPS
50000.0000 [IU] | ORAL_CAPSULE | ORAL | 0 refills | Status: DC
Start: 2017-08-15 — End: 2017-11-01

## 2017-08-15 NOTE — Assessment & Plan Note (Signed)
Believe the patient did have a potential injury of her stress fracture true fracture.  Mild callus formation in the area.  Increasing vascularity.  Discussed icing regimen, topical anti-inflammatories.  Patient does have some limited range of motion and given exercises today.  Patient to follow-up again in 3 weeks to make sure that the soft tissue changes seems to go away as well.

## 2017-08-15 NOTE — Patient Instructions (Addendum)
Good to see you  Angela [name] is your friend. Ice 20 minutes 2 times daily. Usually after activity and before bed. Exercises 3 times a week.  pennsaid pinkie amount topically 2 times daily as needed.  Compression sleeve with a lot of activity  Very light lifting with a bar and not free weights See me again in 4 weeks

## 2017-09-12 ENCOUNTER — Ambulatory Visit: Payer: 59 | Admitting: Family Medicine

## 2017-09-14 ENCOUNTER — Ambulatory Visit: Payer: 59 | Admitting: Obstetrics and Gynecology

## 2017-09-20 ENCOUNTER — Ambulatory Visit (INDEPENDENT_AMBULATORY_CARE_PROVIDER_SITE_OTHER): Payer: 59 | Admitting: Family Medicine

## 2017-09-20 ENCOUNTER — Ambulatory Visit: Payer: Self-pay

## 2017-09-20 ENCOUNTER — Encounter: Payer: Self-pay | Admitting: Family Medicine

## 2017-09-20 VITALS — BP 118/70 | HR 76 | Ht 61.5 in | Wt 114.0 lb

## 2017-09-20 DIAGNOSIS — M25521 Pain in right elbow: Secondary | ICD-10-CM

## 2017-09-20 DIAGNOSIS — S52124D Nondisplaced fracture of head of right radius, subsequent encounter for closed fracture with routine healing: Secondary | ICD-10-CM

## 2017-09-20 NOTE — Assessment & Plan Note (Signed)
I believe the fracture is resolved at this time the patient does still have a cyst.  Possible ganglion cyst.  Cyst on the neck of the radial head.  Patient does have some increasing vascularity surrounding the area but does not appear to go to the cyst itself.  Discussed with patient about the possible need for advanced imaging but at this moment with a kind of getting smaller in size and patient 75% improvement in range of motion and pain that I do not think it would change her medical management at the moment.  Patient wants to continue to monitor.  We discussed different changes that could be beneficial.  Follow-up with me again 2 to 3 months

## 2017-09-20 NOTE — Progress Notes (Signed)
[name] [address], [place] [address] Phone: [phone] Subjective:      I Angela [name] am serving as a Education administrator for Dr. Hulan Saas.   CC: Right elbow pain follow-up  [id number]  Angela [name] is a 62 y.o. female coming in with complaint of elbow pain. States her elbow is doing better. Played tennis recently and she states that everything went well. Still can't complete a bicep curl. Patient states that it is approximately 75% better though.  Has noticed that the fullness that she was feeling previously has improved as well.  Denies any fevers chills or any abnormal weight loss     Past Medical History:  Diagnosis Date  . Abnormal Pap smear of cervix 07-31-13   --pt. 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 Allergy   . Arthritis   . Chicken pox   . Heart murmur   . Osteopenia   . Thyroid disease    Past Surgical History:  Procedure Laterality Date  . ABDOMINAL HYSTERECTOMY    . PILONIDAL CYST DRAINAGE  age 11  . ROBOTIC ASSISTED SUPRACERVICAL HYSTERECTOMY  02/12/07   Adenomyosis and fibroids; Ovaries remain; [place]  . TONSILLECTOMY AND ADENOIDECTOMY  child   Social History   Socioeconomic History  . Marital status: Married    Spouse name: Not on file  . Number of children: Not on file  . Years of education: Not on file  . Highest education level: Not on file  Occupational History  . Not on file  Social Needs  . Financial resource strain: Not on file  . Food insecurity:    Worry: Not on file    Inability: Not on file  . Transportation needs:    Medical: Not on file    Non-medical: Not on file  Tobacco Use  . Smoking status: Never Smoker  . Smokeless tobacco: Never Used  Substance and Sexual Activity  . Alcohol use: Yes    Alcohol/week: 7.0 standard drinks    Types: 7 Glasses of wine per week  . Drug use: No  . Sexual activity: Yes    Partners: Male    Birth  control/protection: Surgical    Comment: hysterectomy  Lifestyle  . Physical activity:    Days per week: Not on file    Minutes per session: Not on file  . Stress: Not on file  Relationships  . Social connections:    Talks on phone: Not on file    Gets together: Not on file    Attends religious service: Not on file    Active member of club or organization: Not on file    Attends meetings of clubs or organizations: Not on file    Relationship status: Not on file  Other Topics Concern  . Not on file  Social History Narrative  . Not on file   No Known Allergies Family History  Problem Relation Age of Onset  . Hypertension Mother   . Osteoporosis Mother   . Hyperlipidemia Father   . Non-Hodgkin's lymphoma Father   . Hypertension Maternal Grandmother   . Stroke Maternal Grandmother   . Osteoporosis Unknown     Current Outpatient Medications (Endocrine & Metabolic):  .  liothyronine (CYTOMEL) 5 MCG tablet, Take 1 tablet (5 mcg total) by mouth daily. Marland Kitchen  SYNTHROID 75 MCG tablet, Take 1 tablet (75 mcg total) by mouth daily.      Current Outpatient Medications (Other):  .  fish oil-omega-3 fatty acids 1000 MG capsule, Take 2 g by mouth daily. .  Multiple Vitamin (MULTIVITAMIN) tablet, Take 1 tablet by mouth daily. .  NONFORMULARY OR COMPOUNDED ITEM, Bi-Est 5 mg 70 to 30 ratio 2-4 clicks daily. Dispense 1 month supply 0RF. Marland Kitchen  NONFORMULARY OR COMPOUNDED ITEM, Progesterone SR 200 mg take 1 tablet daily. .  Vitamin D, Ergocalciferol, (DRISDOL) 50000 units CAPS capsule, Take 1 capsule (50,000 Units total) by mouth every 7 (seven) days.    Past medical history, social, surgical and family history all reviewed in electronic medical record.  No pertanent information unless stated regarding to the chief complaint.   Review of Systems:  No headache, visual changes, nausea, vomiting, diarrhea, constipation, dizziness, abdominal pain, skin rash, fevers, chills, night sweats, weight  loss, swollen lymph nodes, body aches, joint swelling, muscle aches, chest pain, shortness of breath, mood changes.   Objective  Blood pressure 118/70, pulse 76, height 5' 1.5" (1.562 m), weight 114 lb (51.7 kg), last menstrual period 02/12/2007, SpO2 97 %.    General: No apparent distress alert and oriented x3 mood and affect normal, dressed appropriately.  HEENT: Pupils equal, extraocular movements intact  Respiratory: Patient's speak in full sentences and does not appear short of breath  Cardiovascular: No lower extremity edema, non tender, no erythema  Skin: Warm dry intact with no signs of infection or rash on extremities or on axial skeleton.  Abdomen: Soft nontender  Neuro: Cranial nerves II through XII are intact, neurovascularly intact in all extremities with 2+ DTRs and 2+ pulses.  Lymph: No lymphadenopathy of posterior or anterior cervical chain or axillae bilaterally.  Gait normal with good balance and coordination.  MSK:  Non tender with full range of motion and good stability and symmetric strength and tone of shoulders,  wrist, hip, knee and ankles bilaterally.  Elbow: Right Unremarkable to inspection. Range of motion does have some mild limitation lacking the last 5 degrees of flexion in the last 2 degrees of extension. Stable to varus, valgus stress. Negative moving valgus stress test. Tenderness over the right radial head Ulnar nerve does not sublux. Negative cubital tunnel Tinel's. Contralateral elbow unremarkable  Limited muscular skeletal ultrasound was performed and interpreted by Lyndal Pulley Limited ultrasound shows the patient does have still a cyst formation right at the radial head neck.  Patient's previous area that was concerning for fracture seems to be well-healed.  Still some mild increase in vascularity in the area but now can appreciate that there is no vascularity coming from the very small mass.  Seems to be fluid-filled. Impression: Likely ganglion  cyst of the right radial neck.   Impression and Recommendations:     This case required medical decision making of moderate complexity. The above documentation has been reviewed and is accurate and complete Lyndal Pulley, DO       Note: This dictation was prepared with Dragon dictation along with smaller phrase technology. Any transcriptional errors that result from this process are unintentional.

## 2017-09-20 NOTE — Patient Instructions (Signed)
Good to see you  Angela [name] is your friend.  Keep doing what you are doing.  Decrease the gun show but if you are doing it decrease range of motion by 10 degrees in both directions.  See me again in 2-3 months to make sure smaller again

## 2017-10-04 ENCOUNTER — Encounter: Payer: Self-pay | Admitting: Obstetrics and Gynecology

## 2017-10-04 ENCOUNTER — Other Ambulatory Visit (HOSPITAL_COMMUNITY)
Admission: RE | Admit: 2017-10-04 | Discharge: 2017-10-04 | Disposition: A | Discharge status: Home / Self Care | Payer: 59 | Source: Ambulatory Visit | Attending: Obstetrics and Gynecology | Admitting: Obstetrics and Gynecology

## 2017-10-04 ENCOUNTER — Other Ambulatory Visit: Payer: Self-pay

## 2017-10-04 ENCOUNTER — Ambulatory Visit (INDEPENDENT_AMBULATORY_CARE_PROVIDER_SITE_OTHER): Payer: 59 | Admitting: Obstetrics and Gynecology

## 2017-10-04 VITALS — BP 124/72 | HR 71 | Ht 61.0 in | Wt 115.0 lb

## 2017-10-04 DIAGNOSIS — Z780 Asymptomatic menopausal state: Secondary | ICD-10-CM

## 2017-10-04 DIAGNOSIS — M8580 Other specified disorders of bone density and structure, unspecified site: Secondary | ICD-10-CM

## 2017-10-04 DIAGNOSIS — Z01419 Encounter for gynecological examination (general) (routine) without abnormal findings: Secondary | ICD-10-CM | POA: Diagnosis not present

## 2017-10-04 NOTE — Patient Instructions (Signed)

## 2017-10-04 NOTE — Progress Notes (Signed)
62 y.o. [id number] Married Caucasian female here for annual exam.    Still on compounded HRT.  Can have some night sweats, but none during the day.  On cytomel. Going to [employer].   Some loose BMs.  Cologuard ordered PCP and was normal.  Occasional palpitations.  Less than in the past.  Not associated with anything else.  Had normal evaluation in the past.   Routine labs with PCP.   PCP:   Angela [name]  Patient's last menstrual period was 02/12/2007 (exact date).           Sexually active: Yes.    The current method of family planning is status post hysterectomy.    Exercising: Yes.    spin, cardio and weight and tennis Smoker:  no  Health Maintenance: Pap:  09/06/2016 Pap and HR HPV negative and normal History of abnormal Pap:  Yes, 08-21-14 ASCUS:Pos HR HPV with colposcopy revealing atypia on exocervical bx and Neg ECC. MMG:  03/22/2017 BI-RADS CATEGORY  1: Negative. Colonoscopy:  2010 normal follow up 10 years BMD:   10/26/2010  Result  Osteopenia TDaP:  2014 Gardasil:   no [id number] not concerned Hep C:patient not concerned Screening Labs:  Done with PCP and other provider Hb today: n/a, Urine today: n/a   reports that she has never smoked. She has never used smokeless tobacco. She reports that she drinks about 7.0 standard drinks of alcohol per week. She reports that she does not use drugs.  Past Medical History:  Diagnosis Date  . Abnormal Pap smear of cervix 07-31-13   --pt. 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 Allergy   . Arthritis   . Chicken pox   . Heart murmur   . Osteopenia   . Thyroid disease     Past Surgical History:  Procedure Laterality Date  . ABDOMINAL HYSTERECTOMY    . PILONIDAL CYST DRAINAGE  age 40  . ROBOTIC ASSISTED SUPRACERVICAL HYSTERECTOMY  02/12/07   Adenomyosis and fibroids; Ovaries remain; [place]  . TONSILLECTOMY AND ADENOIDECTOMY  child    Current Outpatient Medications   Medication Sig Dispense Refill  . fish oil-omega-3 fatty acids 1000 MG capsule Take 2 g by mouth daily.    Marland Kitchen liothyronine (CYTOMEL) 5 MCG tablet Take 1 tablet (5 mcg total) by mouth daily. 90 tablet 1  . Multiple Vitamin (MULTIVITAMIN) tablet Take 1 tablet by mouth daily.    . NONFORMULARY OR COMPOUNDED ITEM Bi-Est 5 mg 70 to 30 ratio 2-4 clicks daily. Dispense 1 month supply 0RF. 1 each 0  . NONFORMULARY OR COMPOUNDED ITEM Progesterone SR 200 mg take 1 tablet daily. 30 each 0  . SYNTHROID 75 MCG tablet Take 1 tablet (75 mcg total) by mouth daily. 30 tablet 1  . Vitamin D, Ergocalciferol, (DRISDOL) 50000 units CAPS capsule Take 1 capsule (50,000 Units total) by mouth every 7 (seven) days. 12 capsule 0   No current facility-administered medications for this visit.     Family History  Problem Relation Age of Onset  . Hypertension Mother   . Osteoporosis Mother   . Hyperlipidemia Father   . Non-Hodgkin's lymphoma Father   . Hypertension Maternal Grandmother   . Stroke Maternal Grandmother   . Osteoporosis Unknown     Review of Systems  Constitutional: Negative.   HENT: Negative.   Eyes: Negative.   Respiratory: Negative.   Cardiovascular: Negative.   Gastrointestinal:       Change in stool  Endocrine: Negative.   Genitourinary: Negative.   Musculoskeletal: Negative.   Skin: Negative.        Hair loss  Allergic/Immunologic: Negative.   Neurological: Negative.   Hematological: Negative.   Psychiatric/Behavioral: Negative.   All other systems reviewed and are negative.   Exam:   BP 124/72   Pulse 71   Ht 5\' 1"  (1.549 m)   Wt 115 lb (52.2 kg)   LMP 02/12/2007 (Exact Date)   BMI 21.73 kg/m     General appearance: alert, cooperative and appears stated age Head: Normocephalic, without obvious abnormality, atraumatic Neck: no adenopathy, supple, symmetrical, trachea midline and thyroid normal to inspection and palpation Lungs: clear to auscultation bilaterally Breasts:  normal appearance, no masses or tenderness, No nipple retraction or dimpling, No nipple discharge or bleeding, No axillary or supraclavicular adenopathy Heart: regular rate and rhythm Abdomen: soft, non-tender; no masses, no organomegaly Extremities: extremities normal, atraumatic, no cyanosis or edema Skin: Skin color, texture, turgor normal. No rashes or lesions Lymph nodes: Cervical, supraclavicular, and axillary nodes normal. No abnormal inguinal nodes palpated Neurologic: Grossly normal  Pelvic: External genitalia:  no lesions              Urethra:  normal appearing urethra with no masses, tenderness or lesions              Bartholins and Skenes: normal                 Vagina: normal appearing vagina with normal color and discharge, no lesions              Cervix: no lesions              Pap taken: Yes.   Bimanual Exam:  Uterus:  normal size, contour, position, consistency, mobility, non-tender              Adnexa: no mass, fullness, tenderness              Rectal exam: Yes.  .  Confirms.              Anus:  normal sphincter tone, no lesions  Chaperone was present for exam.  Assessment:   Well woman visit with normal exam. On bioidentical HRT. Hx positive HR HPV and LGSIL. Osteopenia.   Plan: Mammogram screening. Recommended self breast awareness. Pap and HR HPV as above. Guidelines for Calcium, Vitamin D, regular exercise program including cardiovascular and weight bearing exercise. BMD ordered.   Patient will schedule at [facility]. HRT through functional medicine MD.  Routine labs with PCP. Follow up annually and prn.   After visit summary provided.

## 2017-10-04 NOTE — Addendum Note (Signed)
Addended by: Yisroel Ramming, Dietrich Pates E on: 10/04/2017 04:17 PM   Modules accepted: Orders

## 2017-10-09 LAB — CYTOLOGY - PAP
Diagnosis: NEGATIVE
HPV (WINDOPATH): NOT DETECTED

## 2017-10-10 DIAGNOSIS — M9903 Segmental and somatic dysfunction of lumbar region: Secondary | ICD-10-CM | POA: Diagnosis not present

## 2017-10-10 DIAGNOSIS — M545 Low back pain: Secondary | ICD-10-CM | POA: Diagnosis not present

## 2017-10-10 DIAGNOSIS — M62830 Muscle spasm of back: Secondary | ICD-10-CM | POA: Diagnosis not present

## 2017-10-15 ENCOUNTER — Telehealth: Payer: Self-pay

## 2017-10-15 DIAGNOSIS — M545 Low back pain: Secondary | ICD-10-CM | POA: Diagnosis not present

## 2017-10-15 DIAGNOSIS — M9903 Segmental and somatic dysfunction of lumbar region: Secondary | ICD-10-CM | POA: Diagnosis not present

## 2017-10-15 DIAGNOSIS — M62830 Muscle spasm of back: Secondary | ICD-10-CM | POA: Diagnosis not present

## 2017-10-15 NOTE — Telephone Encounter (Signed)
-----   Message from Nunzio Cobbs, MD sent at 10/12/2017  5:05 AM EDT ----- Pap and high risk HPV negative.  Pap recall - 02.

## 2017-10-15 NOTE — Telephone Encounter (Signed)
Left message informing patient of normal pap.  Recall entered.

## 2017-10-22 DIAGNOSIS — D485 Neoplasm of uncertain behavior of skin: Secondary | ICD-10-CM | POA: Diagnosis not present

## 2017-11-01 ENCOUNTER — Other Ambulatory Visit: Payer: Self-pay | Admitting: Family Medicine

## 2017-11-08 ENCOUNTER — Ambulatory Visit (INDEPENDENT_AMBULATORY_CARE_PROVIDER_SITE_OTHER): Payer: 59

## 2017-11-08 DIAGNOSIS — M545 Low back pain: Secondary | ICD-10-CM | POA: Diagnosis not present

## 2017-11-08 DIAGNOSIS — Z23 Encounter for immunization: Secondary | ICD-10-CM | POA: Diagnosis not present

## 2017-11-08 DIAGNOSIS — M9903 Segmental and somatic dysfunction of lumbar region: Secondary | ICD-10-CM | POA: Diagnosis not present

## 2017-11-08 DIAGNOSIS — M62830 Muscle spasm of back: Secondary | ICD-10-CM | POA: Diagnosis not present

## 2017-11-26 DIAGNOSIS — M62830 Muscle spasm of back: Secondary | ICD-10-CM | POA: Diagnosis not present

## 2017-11-26 DIAGNOSIS — M9903 Segmental and somatic dysfunction of lumbar region: Secondary | ICD-10-CM | POA: Diagnosis not present

## 2017-11-26 DIAGNOSIS — M545 Low back pain: Secondary | ICD-10-CM | POA: Diagnosis not present

## 2017-12-17 ENCOUNTER — Ambulatory Visit (INDEPENDENT_AMBULATORY_CARE_PROVIDER_SITE_OTHER): Payer: 59 | Admitting: Family Medicine

## 2017-12-17 ENCOUNTER — Ambulatory Visit: Payer: Self-pay

## 2017-12-17 ENCOUNTER — Encounter: Payer: Self-pay | Admitting: Family Medicine

## 2017-12-17 VITALS — BP 110/82 | HR 80 | Ht 61.0 in | Wt 118.0 lb

## 2017-12-17 DIAGNOSIS — M25521 Pain in right elbow: Secondary | ICD-10-CM

## 2017-12-17 DIAGNOSIS — S52124D Nondisplaced fracture of head of right radius, subsequent encounter for closed fracture with routine healing: Secondary | ICD-10-CM | POA: Diagnosis not present

## 2017-12-17 NOTE — Progress Notes (Signed)
[name] [address], [place] [address] Phone: [phone] Subjective:   Angela [name], am serving as a scribe for Dr. Hulan Saas.   CC: Right shoulder pain follow-up  [id number]  Angela [name] is a 62 y.o. female coming in with complaint of right elbow pain. Pain has improved. Pain increases with weight in her hand. [name] pain with flexion. Has played tennis since last visit but is not able to slice.        Past Medical History:  Diagnosis Date  . Abnormal Pap smear of cervix 07-31-13   --pt. 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 Allergy   . Arthritis   . Chicken pox   . Heart murmur   . Osteopenia   . Thyroid disease    Past Surgical History:  Procedure Laterality Date  . ABDOMINAL HYSTERECTOMY    . PILONIDAL CYST DRAINAGE  age 2  . ROBOTIC ASSISTED SUPRACERVICAL HYSTERECTOMY  02/12/07   Adenomyosis and fibroids; Ovaries remain; [place]  . TONSILLECTOMY AND ADENOIDECTOMY  child   Social History   Socioeconomic History  . Marital status: Married    Spouse name: Not on file  . Number of children: Not on file  . Years of education: Not on file  . Highest education level: Not on file  Occupational History  . Not on file  Social Needs  . Financial resource strain: Not on file  . Food insecurity:    Worry: Not on file    Inability: Not on file  . Transportation needs:    Medical: Not on file    Non-medical: Not on file  Tobacco Use  . Smoking status: Never Smoker  . Smokeless tobacco: Never Used  Substance and Sexual Activity  . Alcohol use: Yes    Alcohol/week: 7.0 standard drinks    Types: 7 Glasses of wine per week  . Drug use: [name]  . Sexual activity: Yes    Partners: Male    Birth control/protection: Surgical    Comment: hysterectomy  Lifestyle  . Physical activity:    Days per week: Not on file    Minutes per session: Not on file  . Stress: Not on file    Relationships  . Social connections:    Talks on phone: Not on file    Gets together: Not on file    Attends religious service: Not on file    Active member of club or organization: Not on file    Attends meetings of clubs or organizations: Not on file    Relationship status: Not on file  Other Topics Concern  . Not on file  Social History Narrative  . Not on file   [name] Known Allergies Family History  Problem Relation Age of Onset  . Hypertension Mother   . Osteoporosis Mother   . Hyperlipidemia Father   . Non-Hodgkin's lymphoma Father   . Hypertension Maternal Grandmother   . Stroke Maternal Grandmother   . Osteoporosis Unknown     Current Outpatient Medications (Endocrine & Metabolic):  .  liothyronine (CYTOMEL) 5 MCG tablet, Take 1 tablet (5 mcg total) by mouth daily. Marland Kitchen  SYNTHROID 75 MCG tablet, Take 1 tablet (75 mcg total) by mouth daily.      Current Outpatient Medications (Other):  .  fish oil-omega-3 fatty acids 1000 MG capsule, Take 2 g by mouth daily. .  Multiple Vitamin (MULTIVITAMIN) tablet, Take 1 tablet by mouth daily. Marland Kitchen  NONFORMULARY OR COMPOUNDED ITEM, Bi-Est 5 mg 70 to 30 ratio 2-4 clicks daily. Dispense 1 month supply 0RF. Marland Kitchen  NONFORMULARY OR COMPOUNDED ITEM, Progesterone SR 200 mg take 1 tablet daily. .  Vitamin D, Ergocalciferol, (DRISDOL) 50000 units CAPS capsule, TAKE 1 CAPSULE (50,000 UNITS TOTAL) BY MOUTH EVERY 7 (SEVEN) DAYS.    Past medical history, social, surgical and family history all reviewed in electronic medical record.  [name] pertanent information unless stated regarding to the chief complaint.   Review of Systems:  [name] headache, visual changes, nausea, vomiting, diarrhea, constipation, dizziness, abdominal pain, skin rash, fevers, chills, night sweats, weight loss, swollen lymph nodes, body aches, joint swelling, muscle aches, chest pain, shortness of breath, mood changes.   Objective  Blood pressure 110/82, pulse 80, height 5\' 1"  (1.549  m), weight 118 lb (53.5 kg), last menstrual period 02/12/2007, SpO2 98 %.    General: [name] apparent distress alert and oriented x3 mood and affect normal, dressed appropriately.  HEENT: Pupils equal, extraocular movements intact  Respiratory: Patient's speak in full sentences and does not appear short of breath  Cardiovascular: [name] lower extremity edema, non tender, [name] erythema  Skin: Warm dry intact with [name] signs of infection or rash on extremities or on axial skeleton.  Abdomen: Soft nontender  Neuro: Cranial nerves II through XII are intact, neurovascularly intact in all extremities with 2+ DTRs and 2+ pulses.  Lymph: [name] lymphadenopathy of posterior or anterior cervical chain or axillae bilaterally.  Gait normal with good balance and coordination.  MSK:  Non tender with full range of motion and good stability and symmetric strength and tone of shoulders, wrist, hip, knee and ankles bilaterally.   Right elbow shows improvement in the range of motion.  Full range of motion in all ankles.  Good strength.  Does have tenderness with resisted flexion of the elbow though.  Still some tenderness over the radial head in the antecubital fossa  Limited musculoskeletal ultrasound was performed and interpreted by Lyndal Pulley  Limited ultrasound shows the patient does have systems noted.  Patient does have some abnormal vascularity surrounding the area but does not appear to be coming from the cyst itself.  [name] true tendon injury though noted.  Very mild tenosynovitis of the distal bicep tendon   Impression and Recommendations:     This case required medical decision making of moderate complexity. The above documentation has been reviewed and is accurate and complete Lyndal Pulley, DO       Note: This dictation was prepared with Dragon dictation along with smaller phrase technology. Any transcriptional errors that result from this process are unintentional.

## 2017-12-17 NOTE — Patient Instructions (Signed)
Good to see you  [email or web address] size small elbow sleeve Stay active and go to town and see how it does Still avoid lifting underhand when you can  See me again in 3 months just to look at the cyst

## 2017-12-17 NOTE — Assessment & Plan Note (Signed)
Overall completely healed.  Patient does have an overlying cyst noted on ultrasound.  Patient's x-rays were unremarkable at last exam that were independently visualized by me.  We discussed the possibility of an MRI.  With patient no improvement in range of motion and the pain seems to be improving we will continue to monitor.  Patient wants to follow-up again in 3 months.  Knows if worsening symptoms will come back sooner.

## 2018-01-21 ENCOUNTER — Encounter: Payer: Self-pay | Admitting: Gastroenterology

## 2018-01-23 DIAGNOSIS — M545 Low back pain: Secondary | ICD-10-CM | POA: Diagnosis not present

## 2018-01-23 DIAGNOSIS — M62830 Muscle spasm of back: Secondary | ICD-10-CM | POA: Diagnosis not present

## 2018-01-23 DIAGNOSIS — M9903 Segmental and somatic dysfunction of lumbar region: Secondary | ICD-10-CM | POA: Diagnosis not present

## 2018-02-13 DIAGNOSIS — M545 Low back pain: Secondary | ICD-10-CM | POA: Diagnosis not present

## 2018-02-13 DIAGNOSIS — M62830 Muscle spasm of back: Secondary | ICD-10-CM | POA: Diagnosis not present

## 2018-02-13 DIAGNOSIS — M9903 Segmental and somatic dysfunction of lumbar region: Secondary | ICD-10-CM | POA: Diagnosis not present

## 2018-02-25 ENCOUNTER — Ambulatory Visit: Payer: 59 | Admitting: Internal Medicine

## 2018-02-27 ENCOUNTER — Ambulatory Visit (AMBULATORY_SURGERY_CENTER): Payer: Self-pay | Admitting: *Deleted

## 2018-02-27 ENCOUNTER — Other Ambulatory Visit: Payer: Self-pay

## 2018-02-27 ENCOUNTER — Encounter: Payer: Self-pay | Admitting: Gastroenterology

## 2018-02-27 ENCOUNTER — Telehealth: Payer: Self-pay | Admitting: *Deleted

## 2018-02-27 VITALS — Ht 61.0 in | Wt 117.2 lb

## 2018-02-27 DIAGNOSIS — Z1211 Encounter for screening for malignant neoplasm of colon: Secondary | ICD-10-CM

## 2018-02-27 MED ORDER — SUPREP BOWEL PREP KIT 17.5-3.13-1.6 GM/177ML PO SOLN: 1.0000 | Freq: Once | ORAL | 0 refills | Status: AC

## 2018-02-27 NOTE — Progress Notes (Signed)
No egg or soy allergy known to patient  No issues with past sedation with any surgeries  or procedures, no intubation problems  No diet pills per patient No home 02 use per patient  No blood thinners per patient  Pt denies issues with constipation  No A fib or A flutter  EMMI video offered and declined [place], RN Continue on with Previsit, have patient call her insurance company regarding coverage for screening procedure following neg. Cologuard. Patient verbalized agreement with plan

## 2018-02-27 NOTE — Telephone Encounter (Signed)
Dr Fuller Plan, Angela [name] is in PreVisit now. This patient is scheduled for a screening Colonoscopy 03/13/18. Patient had a clear Colon ,01/30/2018. Patient had a Cologuard test 07/17/2017 which was negative. Do you feel she should have the screening Colonoscopy as planned. Patient stating the only GI symptoms are some bloating and some loose stools, yet she admits to lots of food intolerances. These loose stools are not every day.  Please advise.  Thank you,  Margie Ege

## 2018-02-27 NOTE — Telephone Encounter (Signed)
As discussed with Ms. McCraw, proceed with colonoscopy and have patient check on insurance coverage.

## 2018-03-13 ENCOUNTER — Encounter: Payer: Self-pay | Admitting: Gastroenterology

## 2018-03-13 ENCOUNTER — Ambulatory Visit (AMBULATORY_SURGERY_CENTER): Payer: 59 | Admitting: Gastroenterology

## 2018-03-13 VITALS — BP 133/72 | HR 62 | Temp 97.8°F | Resp 15 | Ht 61.0 in | Wt 117.0 lb

## 2018-03-13 DIAGNOSIS — Z1211 Encounter for screening for malignant neoplasm of colon: Secondary | ICD-10-CM | POA: Diagnosis present

## 2018-03-13 MED ORDER — SODIUM CHLORIDE 0.9 % IV SOLN: 500.0000 mL | Freq: Once | INTRAVENOUS | Status: DC

## 2018-03-13 NOTE — Progress Notes (Signed)
Pt's states no medical or surgical changes since previsit or office visit. 

## 2018-03-13 NOTE — Patient Instructions (Signed)
YOU HAD AN ENDOSCOPIC PROCEDURE TODAY AT THE [facility]:   Refer to the procedure report that was given to you for any specific questions about what was found during the examination.  If the procedure report does not answer your questions, please call your gastroenterologist to clarify.  If you requested that your care partner not be given the details of your procedure findings, then the procedure report has been included in a sealed envelope for you to review at your convenience later.  YOU SHOULD EXPECT: Some feelings of bloating in the abdomen. Passage of more gas than usual.  Walking can help get rid of the air that was put into your GI tract during the procedure and reduce the bloating. If you had a lower endoscopy (such as a colonoscopy or flexible sigmoidoscopy) you may notice spotting of blood in your stool or on the toilet paper. If you underwent a bowel prep for your procedure, you may not have a normal bowel movement for a few days.  Please Note:  You might notice some irritation and congestion in your nose or some drainage.  This is from the oxygen used during your procedure.  There is no need for concern and it should clear up in a day or so.  SYMPTOMS TO REPORT IMMEDIATELY:   Following lower endoscopy (colonoscopy or flexible sigmoidoscopy):  Excessive amounts of blood in the stool  Significant tenderness or worsening of abdominal pains  Swelling of the abdomen that is new, acute  Fever of 100F or higher  For urgent or emergent issues, a gastroenterologist can be reached at any hour by calling [phone].   DIET:  We do recommend a small meal at first, but then you may proceed to your regular diet.  Drink plenty of fluids but you should avoid alcoholic beverages for 24 hours.  ACTIVITY:  You should plan to take it easy for the rest of today and you should NOT DRIVE or use heavy machinery until tomorrow (because of the sedation medicines used during the test).     FOLLOW UP: Our staff will call the number listed on your records the next business day following your procedure to check on you and address any questions or concerns that you may have regarding the information given to you following your procedure. If we do not reach you, we will leave a message.  However, if you are feeling well and you are not experiencing any problems, there is no need to return our call.  We will assume that you have returned to your regular daily activities without incident.  If any biopsies were taken you will be contacted by phone or by letter within the next 1-3 weeks.  Please call us at [phone] if you have not heard about the biopsies in 3 weeks.    SIGNATURES/CONFIDENTIALITY: You and/or your care partner have signed paperwork which will be entered into your electronic medical record.  These signatures attest to the fact that that the information above on your After Visit Summary has been reviewed and is understood.  Full responsibility of the confidentiality of this discharge information lies with you and/or your care-partner. 

## 2018-03-13 NOTE — Progress Notes (Signed)
To PACU, VSS. Report to [email or web address] 

## 2018-03-13 NOTE — Op Note (Signed)
[facility] Patient Name: Angela [name] Procedure Date: 03/13/2018 8:34 AM MRN: [id number] Endoscopist: Ladene Artist , MD Age: 63 Referring MD:  Date of Birth: [birth year] Gender: Female Account #: [id number] Procedure:                Colonoscopy Indications:              Screening for colorectal malignant neoplasm Medicines:                Monitored Anesthesia Care Procedure:                Pre-Anesthesia Assessment:                           - Prior to the procedure, a History and Physical                            was performed, and patient medications and                            allergies were reviewed. The patient's tolerance of                            previous anesthesia was also reviewed. The risks                            and benefits of the procedure and the sedation                            options and risks were discussed with the patient.                            All questions were answered, and informed consent                            was obtained. Prior Anticoagulants: The patient has                            taken no previous anticoagulant or antiplatelet                            agents. ASA Grade Assessment: II - A patient with                            mild systemic disease. After reviewing the risks                            and benefits, the patient was deemed in                            satisfactory condition to undergo the procedure.                           After obtaining informed consent, the colonoscope  was passed under direct vision. Throughout the                            procedure, the patient's blood pressure, pulse, and                            oxygen saturations were monitored continuously. The                            Model PCF-[id number]) scope was introduced                            through the anus and advanced to the the cecum,                            identified by  appendiceal orifice and ileocecal                            valve. The ileocecal valve, appendiceal orifice,                            and rectum were photographed. The quality of the                            bowel preparation was good. The colonoscopy was                            performed without difficulty. The patient tolerated                            the procedure well. Scope In: 8:45:11 AM Scope Out: 8:56:31 AM Scope Withdrawal Time: 0 hours 7 minutes 24 seconds  Total Procedure Duration: 0 hours 11 minutes 20 seconds  Findings:                 The perianal and digital rectal examinations were                            normal.                           Internal hemorrhoids were found during                            retroflexion. The hemorrhoids were small and Grade                            I (internal hemorrhoids that do not prolapse).                           A diffuse area of moderate melanosis was found in                            the entire colon. Complications:            No immediate  complications. Estimated blood loss:                            None. Estimated Blood Loss:     Estimated blood loss: none. Impression:               - Internal hemorrhoids.                           - Melanosis in the colon.                           - No specimens collected. Recommendation:           - Repeat colonoscopy in 10 years for screening                            purposes.                           - Patient has a contact number available for                            emergencies. The signs and symptoms of potential                            delayed complications were discussed with the                            patient. Return to normal activities tomorrow.                            Written discharge instructions were provided to the                            patient.                           - Resume previous diet.                           - Continue  present medications. Ladene Artist, MD 03/13/2018 9:03:58 AM This report has been signed electronically.

## 2018-03-14 ENCOUNTER — Telehealth: Payer: Self-pay | Admitting: *Deleted

## 2018-03-14 NOTE — Telephone Encounter (Signed)
  Follow up Call-  Call back number 03/13/2018  Post procedure Call Back phone  # [phone]  Permission to leave phone message Yes  Some recent data might be hidden     Patient questions:  Do you have a fever, pain , or abdominal swelling? No. Pain Score  0 *  Have you tolerated food without any problems? Yes.    Have you been able to return to your normal activities? Yes.    Do you have any questions about your discharge instructions: Diet   No. Medications  No. Follow up visit  No.  Do you have questions or concerns about your Care? No.  Actions: * If pain score is 4 or above: No action needed, pain <4.

## 2018-03-18 ENCOUNTER — Ambulatory Visit: Payer: 59 | Admitting: Family Medicine

## 2018-03-18 DIAGNOSIS — M545 Low back pain: Secondary | ICD-10-CM | POA: Diagnosis not present

## 2018-03-18 DIAGNOSIS — M62830 Muscle spasm of back: Secondary | ICD-10-CM | POA: Diagnosis not present

## 2018-03-18 DIAGNOSIS — M9903 Segmental and somatic dysfunction of lumbar region: Secondary | ICD-10-CM | POA: Diagnosis not present

## 2018-03-19 ENCOUNTER — Ambulatory Visit: Payer: 59 | Admitting: Internal Medicine

## 2018-03-19 DIAGNOSIS — E039 Hypothyroidism, unspecified: Secondary | ICD-10-CM | POA: Insufficient documentation

## 2018-03-19 NOTE — Progress Notes (Deleted)
.    Subjective:    Patient ID: Angela [name], female    DOB: [birth year], 63 y.o.   MRN: [id number]  HPI The patient is here for follow up.  Hypothyroidism:  She is taking her medication daily.  She denies any recent changes in energy or weight that are unexplained.   Osteopenia:    Medications and allergies reviewed with patient and updated if appropriate.  Patient Active Problem List   Diagnosis Date Noted  . Radial head fracture, closed 08/15/2017  . PULMONARY NODULE, LEFT LOWER LOBE 10/02/2007  . SYMPTOMATIC MENOPAUSAL/FEMALE CLIMACTERIC STATES 10/02/2007  . ALLERGIC RHINITIS 10/24/2006  . Osteopenia 10/24/2006    Current Outpatient Medications on File Prior to Visit  Medication Sig Dispense Refill  . b complex vitamins tablet Take 1 tablet by mouth daily.    . CHROMIUM PICOLINATE PO Take by mouth.    . fish oil-omega-3 fatty acids 1000 MG capsule Take 2 g by mouth daily.    Marland Kitchen liothyronine (CYTOMEL) 5 MCG tablet Take 1 tablet (5 mcg total) by mouth daily. 90 tablet 1  . Multiple Vitamin (MULTIVITAMIN) tablet Take 1 tablet by mouth daily.    . NON FORMULARY Intestinal Formula, Dr Lennice Sites    . NONFORMULARY OR COMPOUNDED ITEM Bi-Est 5 mg 70 to 30 ratio 2-4 clicks daily. Dispense 1 month supply 0RF. 1 each 0  . NONFORMULARY OR COMPOUNDED ITEM Progesterone SR 200 mg take 1 tablet daily. 30 each 0  . Probiotic Product (PROBIOTIC PO) Take by mouth.    . SYNTHROID 75 MCG tablet Take 1 tablet (75 mcg total) by mouth daily. 30 tablet 1  . UNABLE TO FIND Testosterone 0.25 mg every day (compounded)    . UNABLE TO FIND Quercetine 800 mg po 3 times per day    . VITAMIN D PO Take by mouth. Vit d 5000 2 x daily    . Vitamin D, Ergocalciferol, (DRISDOL) 50000 units CAPS capsule TAKE 1 CAPSULE (50,000 UNITS TOTAL) BY MOUTH EVERY 7 (SEVEN) DAYS. 12 capsule 0   No current facility-administered medications on file prior to visit.     Past Medical History:  Diagnosis Date  . Abnormal  Pap smear of cervix 07-31-13   --pt. 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 Allergy   . Arthritis   . Chicken pox   . Heart murmur   . Osteopenia   . Thyroid disease     Past Surgical History:  Procedure Laterality Date  . ABDOMINAL HYSTERECTOMY    . COLONOSCOPY    . PILONIDAL CYST DRAINAGE  age 16  . ROBOTIC ASSISTED SUPRACERVICAL HYSTERECTOMY  02/12/07   Adenomyosis and fibroids; Ovaries remain; [place]  . TONSILLECTOMY AND ADENOIDECTOMY  child    Social History   Socioeconomic History  . Marital status: Married    Spouse name: Not on file  . Number of children: Not on file  . Years of education: Not on file  . Highest education level: Not on file  Occupational History  . Not on file  Social Needs  . Financial resource strain: Not on file  . Food insecurity:    Worry: Not on file    Inability: Not on file  . Transportation needs:    Medical: Not on file    Non-medical: Not on file  Tobacco Use  . Smoking status: Never Smoker  . Smokeless tobacco: Never Used  Substance and Sexual Activity  . Alcohol use: Yes  Alcohol/week: 7.0 standard drinks    Types: 7 Glasses of wine per week  . Drug use: No  . Sexual activity: Yes    Partners: Male    Birth control/protection: Surgical    Comment: hysterectomy  Lifestyle  . Physical activity:    Days per week: Not on file    Minutes per session: Not on file  . Stress: Not on file  Relationships  . Social connections:    Talks on phone: Not on file    Gets together: Not on file    Attends religious service: Not on file    Active member of club or organization: Not on file    Attends meetings of clubs or organizations: Not on file    Relationship status: Not on file  Other Topics Concern  . Not on file  Social History Narrative  . Not on file    Family History  Problem Relation Age of Onset  . Hypertension Mother   . Osteoporosis Mother   . Hyperlipidemia Father     . Non-Hodgkin's lymphoma Father   . Hypertension Maternal Grandmother   . Stroke Maternal Grandmother   . Osteoporosis Other   . Colon cancer Neg Hx   . Colon polyps Neg Hx   . Esophageal cancer Neg Hx   . Stomach cancer Neg Hx   . Rectal cancer Neg Hx     Review of Systems     Objective:  There were no vitals filed for this visit. BP Readings from Last 3 Encounters:  03/13/18 133/72  12/17/17 110/82  10/04/17 124/72   Wt Readings from Last 3 Encounters:  03/13/18 117 lb (53.1 kg)  02/27/18 117 lb 3.2 oz (53.2 kg)  12/17/17 118 lb (53.5 kg)   There is no height or weight on file to calculate BMI.   Physical Exam    Constitutional: Appears well-developed and well-nourished. No distress.  HENT:  Head: Normocephalic and atraumatic.  Neck: Neck supple. No tracheal deviation present. No thyromegaly present.  No cervical lymphadenopathy Cardiovascular: Normal rate, regular rhythm and normal heart sounds.   No murmur heard. No carotid bruit .  No edema Pulmonary/Chest: Effort normal and breath sounds normal. No respiratory distress. No has no wheezes. No rales.  Skin: Skin is warm and dry. Not diaphoretic.  Psychiatric: Normal mood and affect. Behavior is normal.      Assessment & Plan:    See Problem List for Assessment and Plan of chronic medical problems.

## 2018-03-27 DIAGNOSIS — M9903 Segmental and somatic dysfunction of lumbar region: Secondary | ICD-10-CM | POA: Diagnosis not present

## 2018-03-27 DIAGNOSIS — M545 Low back pain: Secondary | ICD-10-CM | POA: Diagnosis not present

## 2018-03-27 DIAGNOSIS — M62830 Muscle spasm of back: Secondary | ICD-10-CM | POA: Diagnosis not present

## 2018-04-16 ENCOUNTER — Ambulatory Visit: Payer: 59 | Admitting: Internal Medicine

## 2018-05-23 ENCOUNTER — Other Ambulatory Visit: Payer: Self-pay | Admitting: Internal Medicine

## 2018-05-23 DIAGNOSIS — Z1231 Encounter for screening mammogram for malignant neoplasm of breast: Secondary | ICD-10-CM

## 2018-05-28 ENCOUNTER — Ambulatory Visit: Payer: 59 | Admitting: Internal Medicine

## 2018-07-08 NOTE — Progress Notes (Signed)
Subjective:    Patient ID: Angela [name], female    DOB: Mar 19, 1955, 63 y.o.   MRN: [id number]  HPI  She is here to establish with a new pcp.  She is here for follow up of her chronic medical conditions.    Hypothyroidism:  She follows with an integrative doctor.  She is taking her medication daily.  She denies any recent changes in energy or weight that are unexplained.   Hand arthritis:  Her mom has osteoarthritis and she does too.  It hurts a little and is stiff.  It does not inhibit her from doing anything.  She plays tennis.  Area on left wrist:  A couple of months ago she noticed a soft lump on the lateral aspect of her left wrist.  It is non-tender.     Medications and allergies reviewed with patient and updated if appropriate.  Patient Active Problem List   Diagnosis Date Noted  . Hypothyroidism 03/19/2018  . Radial head fracture, closed 08/15/2017  . Lung nodule 10/02/2007  . SYMPTOMATIC MENOPAUSAL/FEMALE CLIMACTERIC STATES 10/02/2007  . ALLERGIC RHINITIS 10/24/2006  . Osteopenia 10/24/2006    Current Outpatient Medications on File Prior to Visit  Medication Sig Dispense Refill  . b complex vitamins tablet Take 1 tablet by mouth daily.    . CHROMIUM PICOLINATE PO Take by mouth.    . fish oil-omega-3 fatty acids 1000 MG capsule Take 2 g by mouth daily.    Marland Kitchen liothyronine (CYTOMEL) 5 MCG tablet Take 1 tablet (5 mcg total) by mouth daily. 90 tablet 1  . Multiple Vitamin (MULTIVITAMIN) tablet Take 1 tablet by mouth daily.    . NON FORMULARY Intestinal Formula, Dr Lennice Sites    . NONFORMULARY OR COMPOUNDED ITEM Bi-Est 5 mg 70 to 30 ratio 2-4 clicks daily. Dispense 1 month supply 0RF. 1 each 0  . NONFORMULARY OR COMPOUNDED ITEM Progesterone SR 200 mg take 1 tablet daily. 30 each 0  . Probiotic Product (PROBIOTIC PO) Take by mouth.    . SYNTHROID 75 MCG tablet Take 1 tablet (75 mcg total) by mouth daily. 30 tablet 1  . UNABLE TO FIND Testosterone 0.25 mg every day  (compounded)    . UNABLE TO FIND Quercetine 800 mg po 3 times per day    . VITAMIN D PO Take by mouth. Vit d 5000 2 x daily    . Vitamin D, Ergocalciferol, (DRISDOL) 50000 units CAPS capsule TAKE 1 CAPSULE (50,000 UNITS TOTAL) BY MOUTH EVERY 7 (SEVEN) DAYS. 12 capsule 0   No current facility-administered medications on file prior to visit.     Past Medical History:  Diagnosis Date  . Abnormal Pap smear of cervix 07-31-13   --pt. 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 Allergy   . Arthritis   . Chicken pox   . Heart murmur   . Osteopenia   . Thyroid disease     Past Surgical History:  Procedure Laterality Date  . ABDOMINAL HYSTERECTOMY    . COLONOSCOPY    . PILONIDAL CYST DRAINAGE  age 41  . ROBOTIC ASSISTED SUPRACERVICAL HYSTERECTOMY  02/12/07   Adenomyosis and fibroids; Ovaries remain; [place]  . TONSILLECTOMY AND ADENOIDECTOMY  child    Social History   Socioeconomic History  . Marital status: Married    Spouse name: Not on file  . Number of children: Not on file  . Years of education: Not on file  . Highest education level: Not  on file  Occupational History  . Not on file  Social Needs  . Financial resource strain: Not on file  . Food insecurity    Worry: Not on file    Inability: Not on file  . Transportation needs    Medical: Not on file    Non-medical: Not on file  Tobacco Use  . Smoking status: Never Smoker  . Smokeless tobacco: Never Used  Substance and Sexual Activity  . Alcohol use: Yes    Alcohol/week: 7.0 standard drinks    Types: 7 Glasses of wine per week  . Drug use: No  . Sexual activity: Yes    Partners: Male    Birth control/protection: Surgical    Comment: hysterectomy  Lifestyle  . Physical activity    Days per week: Not on file    Minutes per session: Not on file  . Stress: Not on file  Relationships  . Social Herbalist on phone: Not on file    Gets together: Not on file     Attends religious service: Not on file    Active member of club or organization: Not on file    Attends meetings of clubs or organizations: Not on file    Relationship status: Not on file  Other Topics Concern  . Not on file  Social History Narrative  . Not on file    Family History  Problem Relation Age of Onset  . Hypertension Mother   . Osteoporosis Mother   . Hyperlipidemia Father   . Non-Hodgkin's lymphoma Father   . Hypertension Maternal Grandmother   . Stroke Maternal Grandmother   . Osteoporosis Other   . Colon cancer Neg Hx   . Colon polyps Neg Hx   . Esophageal cancer Neg Hx   . Stomach cancer Neg Hx   . Rectal cancer Neg Hx     Review of Systems  Constitutional: Negative for chills and fever.  Respiratory: Negative for cough, shortness of breath and wheezing.   Cardiovascular: Positive for palpitations (rare). Negative for chest pain and leg swelling.  Gastrointestinal: Negative for abdominal pain and nausea.  Musculoskeletal: Positive for arthralgias.  Neurological: Negative for light-headedness and headaches.       Objective:   Vitals:   07/09/18 0924  BP: 126/80  Pulse: 75  Resp: 16  Temp: 98.5 F (36.9 C)  SpO2: 96%   Filed Weights   07/09/18 0924  Weight: 114 lb 12.8 oz (52.1 kg)   Body mass index is 21.69 kg/m.  BP Readings from Last 3 Encounters:  07/09/18 126/80  03/13/18 133/72  12/17/17 110/82    Wt Readings from Last 3 Encounters:  07/09/18 114 lb 12.8 oz (52.1 kg)  03/13/18 117 lb (53.1 kg)  02/27/18 117 lb 3.2 oz (53.2 kg)     Physical Exam Constitutional: Appears well-developed and well-nourished. No distress.  HENT:  Head: Normocephalic and atraumatic.  Neck: Neck supple. No tracheal deviation present. No thyromegaly present.  No cervical lymphadenopathy Cardiovascular: Normal rate, regular rhythm and normal heart sounds.  No murmur heard. No carotid bruit .  No edema Pulmonary/Chest: Effort normal and breath sounds  normal. No respiratory distress. No has no wheezes. No rales.  Msk: mild OA DIP deformities, left lateral wrist lump - probable lipoma, possible cyst - nontender, mobile Skin: Skin is warm and dry. Not diaphoretic.          Assessment & Plan:     See Problem List for  Assessment and Plan of chronic medical problems.

## 2018-07-08 NOTE — Patient Instructions (Addendum)
It was nice to meet.   Medications reviewed and updated.  Changes include :  none

## 2018-07-09 ENCOUNTER — Ambulatory Visit (INDEPENDENT_AMBULATORY_CARE_PROVIDER_SITE_OTHER): Payer: 59 | Admitting: Internal Medicine

## 2018-07-09 ENCOUNTER — Encounter: Payer: Self-pay | Admitting: Internal Medicine

## 2018-07-09 ENCOUNTER — Other Ambulatory Visit: Payer: Self-pay

## 2018-07-09 VITALS — BP 126/80 | HR 75 | Temp 98.5°F | Resp 16 | Ht 61.0 in | Wt 114.8 lb

## 2018-07-09 DIAGNOSIS — M8580 Other specified disorders of bone density and structure, unspecified site: Secondary | ICD-10-CM

## 2018-07-09 DIAGNOSIS — E039 Hypothyroidism, unspecified: Secondary | ICD-10-CM | POA: Diagnosis not present

## 2018-07-09 DIAGNOSIS — J309 Allergic rhinitis, unspecified: Secondary | ICD-10-CM

## 2018-07-09 DIAGNOSIS — N951 Menopausal and female climacteric states: Secondary | ICD-10-CM

## 2018-07-09 NOTE — Assessment & Plan Note (Signed)
On compounded HRT by Dr Dalbert Batman

## 2018-07-09 NOTE — Assessment & Plan Note (Signed)
Mild, intermittent Takes natural supplements

## 2018-07-09 NOTE — Assessment & Plan Note (Signed)
Monitored by her Gyn Exercising Taking vitamin d, MVI Does not want medication if needed

## 2018-07-09 NOTE — Assessment & Plan Note (Signed)
Management per Dr Dalbert Batman

## 2018-08-16 ENCOUNTER — Other Ambulatory Visit: Payer: 59

## 2018-08-16 ENCOUNTER — Ambulatory Visit: Payer: 59

## 2018-09-01 IMAGING — MG DIGITAL SCREENING BILATERAL MAMMOGRAM WITH TOMO AND CAD
8 series · 9 of 24 positions shown · non-contrast
Comparison: Previous exam(s).

CLINICAL DATA: Screening.

EXAM:
DIGITAL SCREENING BILATERAL MAMMOGRAM WITH TOMO AND CAD

[R MLO synth-2D]
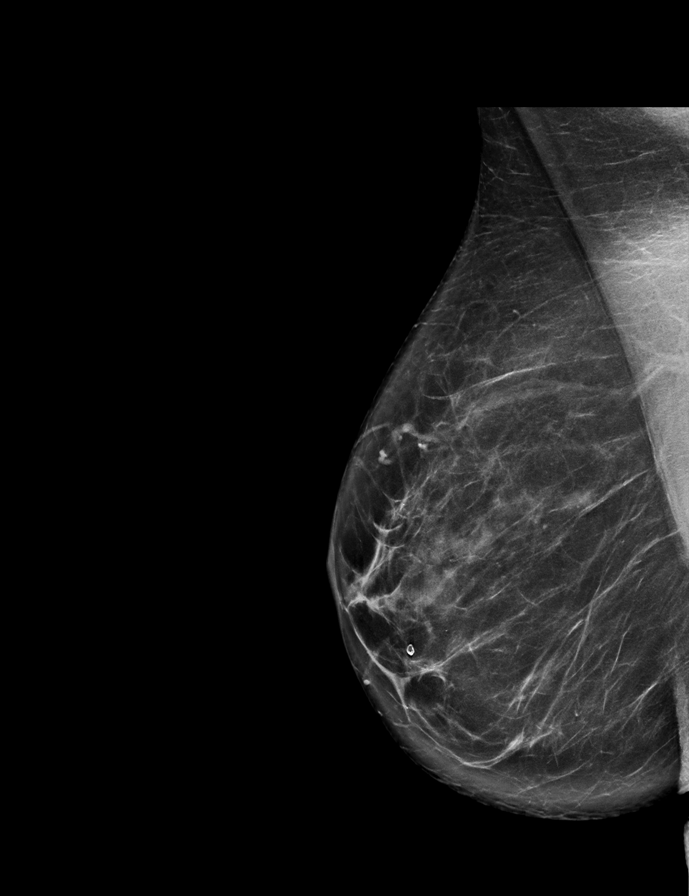

[L MLO synth-2D]
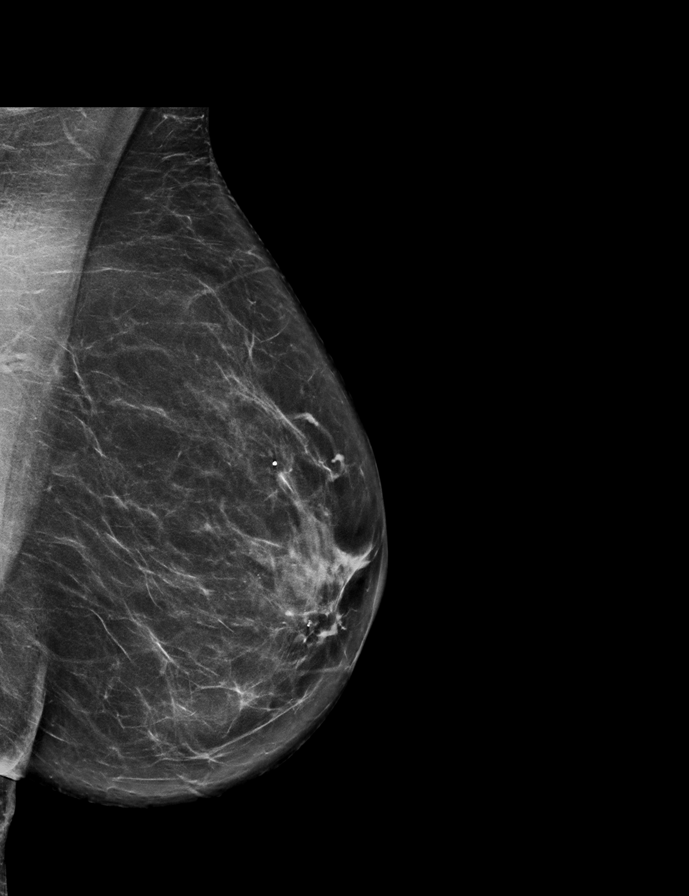

[R CC synth-2D]
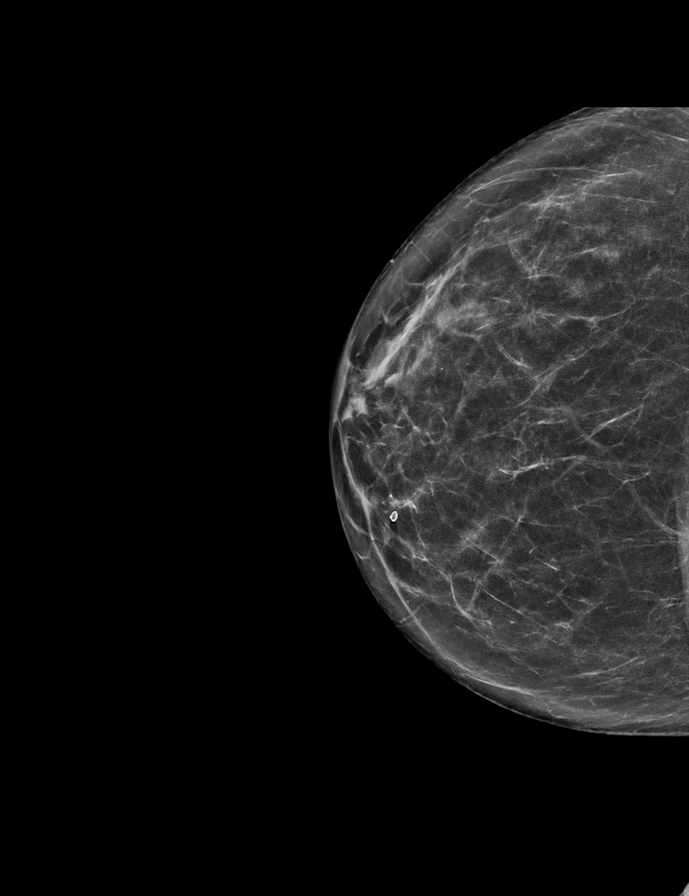

[L CC synth-2D]
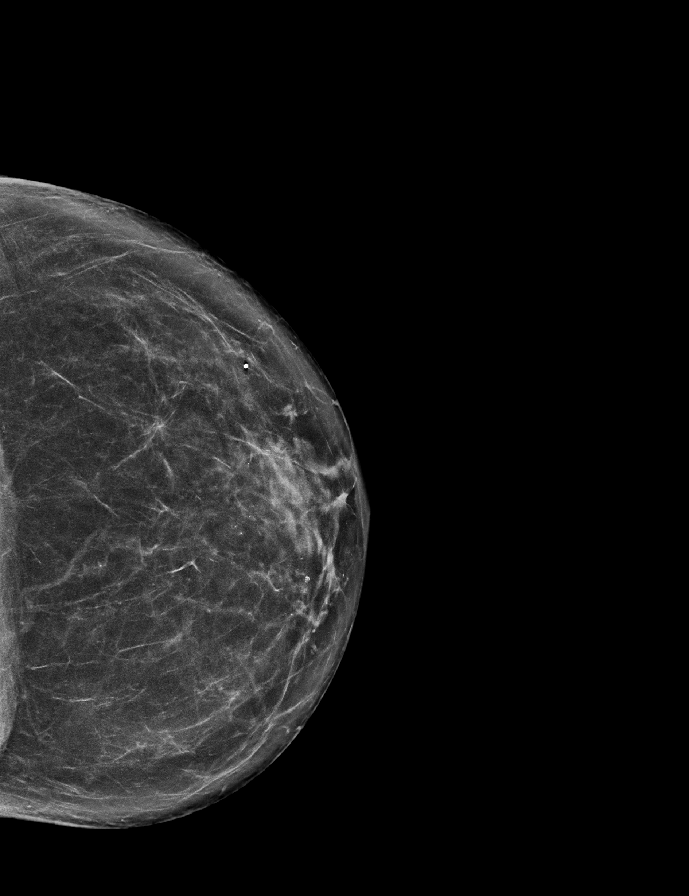

[R CC tomo · 2 of 63 frames shown]
[frame 21/63]
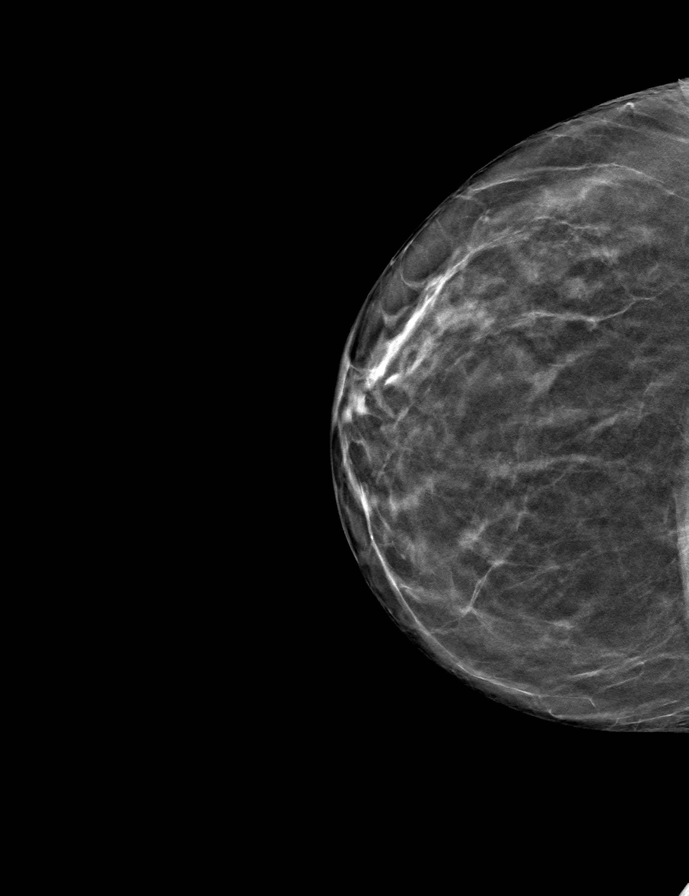
[frame 32/63]
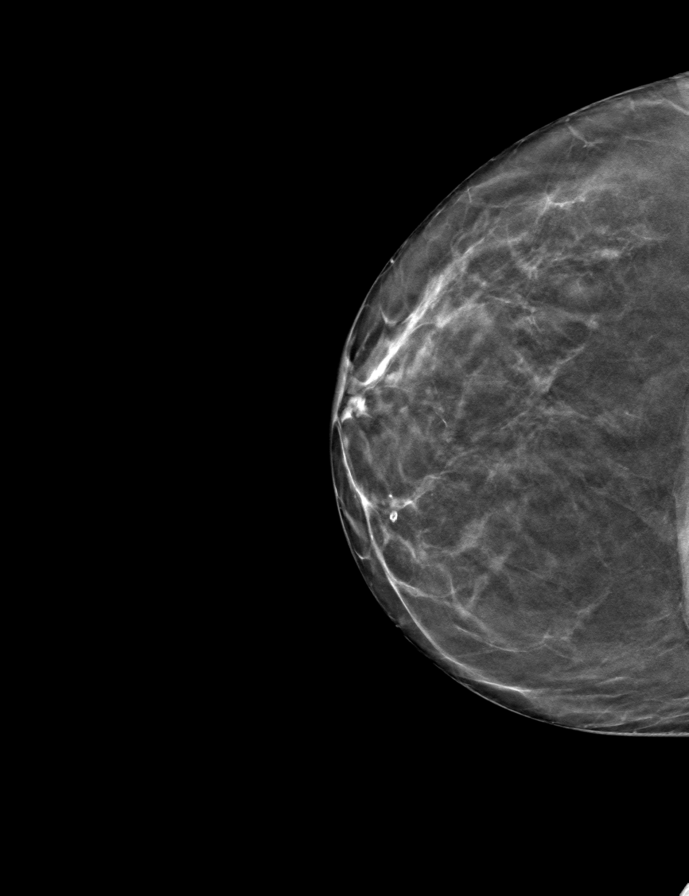

[R MLO tomo · tomo slice 37/74.0]
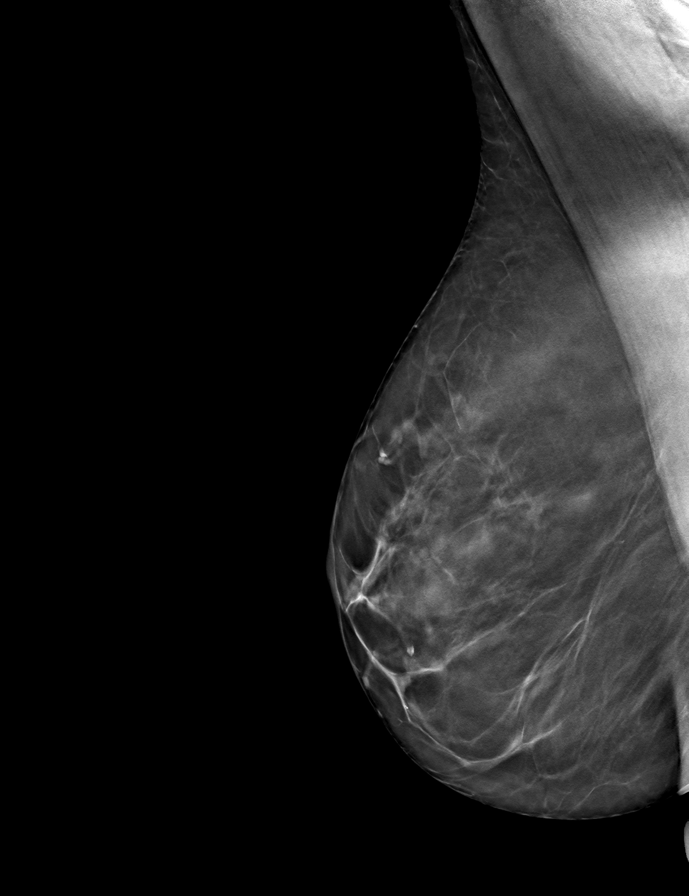

[L MLO tomo · tomo slice 36/71.0]
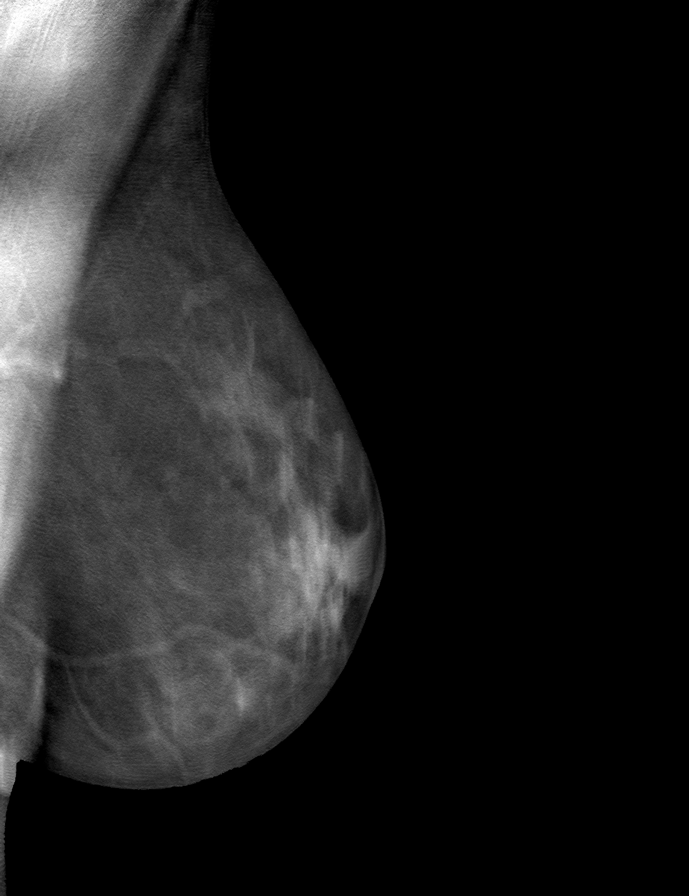

[L CC tomo · tomo slice 33/66.0]
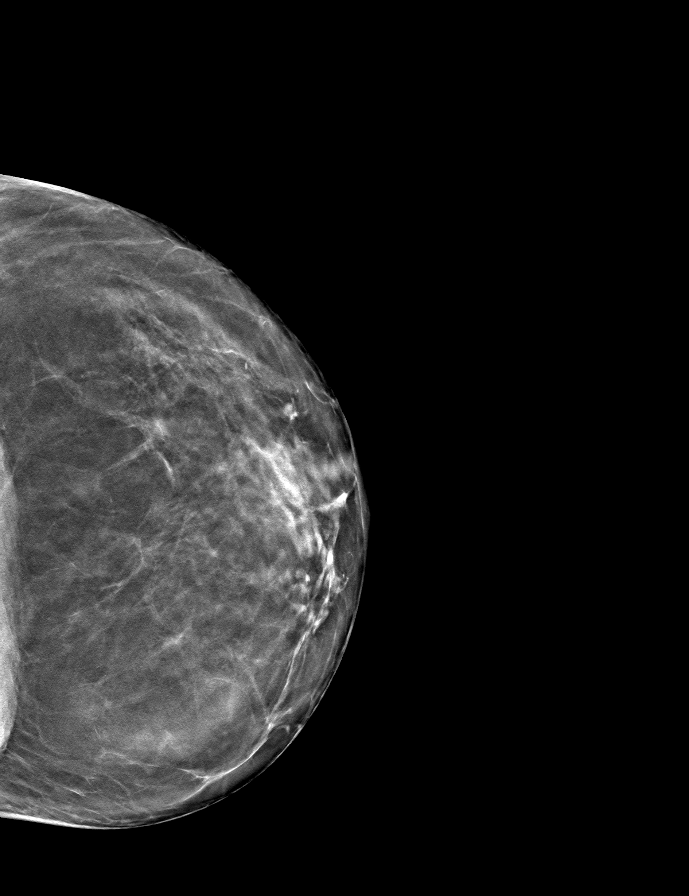

[9 of 24 positions shown; findings below may reference images not displayed]

ACR Breast Density Category b: There are scattered areas of
fibroglandular density.
FINDINGS: There are no findings suspicious for malignancy. Images were
processed with CAD.
IMPRESSION: No mammographic evidence of malignancy. A result letter of this
screening mammogram will be mailed directly to the patient.

RECOMMENDATION:
Screening mammogram in one year. (Code:[id number])

BI-RADS CATEGORY  1: Negative.

## 2018-09-17 ENCOUNTER — Ambulatory Visit
Admission: RE | Admit: 2018-09-17 | Discharge: 2018-09-17 | Disposition: A | Discharge status: Home / Self Care | Payer: 59 | Source: Ambulatory Visit | Attending: Internal Medicine | Admitting: Internal Medicine

## 2018-09-17 ENCOUNTER — Other Ambulatory Visit: Payer: Self-pay

## 2018-09-17 DIAGNOSIS — Z1231 Encounter for screening mammogram for malignant neoplasm of breast: Secondary | ICD-10-CM

## 2018-09-19 ENCOUNTER — Other Ambulatory Visit: Payer: Self-pay | Admitting: Internal Medicine

## 2018-09-19 DIAGNOSIS — R928 Other abnormal and inconclusive findings on diagnostic imaging of breast: Secondary | ICD-10-CM

## 2018-09-25 ENCOUNTER — Ambulatory Visit
Admission: RE | Admit: 2018-09-25 | Discharge: 2018-09-25 | Disposition: A | Discharge status: Home / Self Care | Payer: 59 | Source: Ambulatory Visit | Attending: Internal Medicine | Admitting: Internal Medicine

## 2018-09-25 ENCOUNTER — Other Ambulatory Visit: Payer: Self-pay

## 2018-09-25 DIAGNOSIS — R928 Other abnormal and inconclusive findings on diagnostic imaging of breast: Secondary | ICD-10-CM

## 2018-09-27 ENCOUNTER — Other Ambulatory Visit: Payer: 59

## 2018-10-03 ENCOUNTER — Other Ambulatory Visit: Payer: Self-pay

## 2018-10-03 ENCOUNTER — Ambulatory Visit
Admission: RE | Admit: 2018-10-03 | Discharge: 2018-10-03 | Disposition: A | Discharge status: Home / Self Care | Payer: 59 | Source: Ambulatory Visit | Attending: Obstetrics and Gynecology | Admitting: Obstetrics and Gynecology

## 2018-10-03 DIAGNOSIS — M8580 Other specified disorders of bone density and structure, unspecified site: Secondary | ICD-10-CM

## 2018-10-03 DIAGNOSIS — Z780 Asymptomatic menopausal state: Secondary | ICD-10-CM

## 2018-10-07 ENCOUNTER — Ambulatory Visit: Payer: 59 | Admitting: Obstetrics and Gynecology

## 2018-10-24 ENCOUNTER — Ambulatory Visit: Payer: 59 | Admitting: Obstetrics and Gynecology

## 2018-11-14 ENCOUNTER — Telehealth: Payer: Self-pay | Admitting: Obstetrics and Gynecology

## 2018-11-14 NOTE — Telephone Encounter (Signed)
Left message on voicemail to call and reschedule cancelled appointment. °

## 2018-11-22 ENCOUNTER — Telehealth: Payer: Self-pay | Admitting: *Deleted

## 2018-11-22 NOTE — Telephone Encounter (Signed)
09/17/18: screening MMG at [facility], right breast mass  09/24/17: right breast Dx MMG and [place] Bi-rads 1: neg, screening MMG 1 yr   Dr. Quincy Simmonds -ok to remove from [facility] hold?

## 2018-11-25 NOTE — Telephone Encounter (Signed)
Ok to remove from mammogram hold.  

## 2018-11-25 NOTE — Telephone Encounter (Signed)
Patient removed from MMG hold.   Encounter closed.  

## 2018-11-29 ENCOUNTER — Ambulatory Visit: Payer: 59 | Admitting: Obstetrics and Gynecology

## 2018-12-09 ENCOUNTER — Encounter: Payer: Self-pay | Admitting: Obstetrics and Gynecology

## 2018-12-09 ENCOUNTER — Other Ambulatory Visit: Payer: Self-pay

## 2018-12-09 ENCOUNTER — Ambulatory Visit (INDEPENDENT_AMBULATORY_CARE_PROVIDER_SITE_OTHER): Payer: 59 | Admitting: Obstetrics and Gynecology

## 2018-12-09 VITALS — BP 138/82 | HR 70 | Temp 97.8°F | Resp 14 | Ht 61.0 in | Wt 116.2 lb

## 2018-12-09 DIAGNOSIS — Z01419 Encounter for gynecological examination (general) (routine) without abnormal findings: Secondary | ICD-10-CM

## 2018-12-09 NOTE — Patient Instructions (Signed)

## 2018-12-09 NOTE — Progress Notes (Signed)
63 y.o. [id number] Married Caucasian female here for annual exam.    She is in good health.  She is still on her compounded HRT.  She is getting her HRT from Dr. Dalbert Batman.   Feels her bladder coming down when she plays tennis.  Mother at age 15 has Covid and doing ok.   Stopped at [employer] prior to the pandemic.  PCP: Billey Gosling, MD    Patient's last menstrual period was 02/12/2007 (exact date).           Sexually active: Yes.    The current method of family planning is status post hysterectomy.    Exercising: Yes.    walking, weights, cardio and tennis Smoker:  no  Health Maintenance: Pap:   10/04/17 Neg:Neg HR HPV  09/06/2016 Neg:Neg HR HPV History of abnormal Pap:  Yes, 08-21-14 ASCUS:Pos HR HPV with colposcopy revealing atypia on exocervical bx and Neg ECC. MMG:  09/25/18 Right Breast Diagnostic MM/US - BIRADS 1 negative/density b Colonoscopy:  03/13/18 Normal f/u 10 years BMD:   10/03/18  Result  Osteopenia left hip and spine.  TDaP:  04/22/12 Gardasil:   n/a HIV:no Hep C:no Screening Labs:  Hb today: PCP Flu vaccine:  Recommended.   reports that she has never smoked. She has never used smokeless tobacco. She reports current alcohol use of about 7.0 standard drinks of alcohol per week. She reports that she does not use drugs.  Past Medical History:  Diagnosis Date  . Abnormal Pap smear of cervix 07-31-13   --pt. 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 Allergy   . Arthritis   . Chicken pox   . Heart murmur   . Osteopenia   . Thyroid disease     Past Surgical History:  Procedure Laterality Date  . ABDOMINAL HYSTERECTOMY     fibroids  . COLONOSCOPY    . PILONIDAL CYST DRAINAGE  age 42  . ROBOTIC ASSISTED SUPRACERVICAL HYSTERECTOMY  02/12/07   Adenomyosis and fibroids; Ovaries remain; [place]  . TONSILLECTOMY AND ADENOIDECTOMY  child    Current Outpatient Medications  Medication Sig Dispense Refill  . b complex vitamins  tablet Take 1 tablet by mouth daily.    . CHROMIUM PICOLINATE PO Take by mouth.    . fish oil-omega-3 fatty acids 1000 MG capsule Take 2 g by mouth daily.    Marland Kitchen liothyronine (CYTOMEL) 5 MCG tablet Take 1 tablet (5 mcg total) by mouth daily. 90 tablet 1  . Multiple Vitamin (MULTIVITAMIN) tablet Take 1 tablet by mouth daily.    . Multiple Vitamins-Minerals (ZINC PO) Take 1 tablet by mouth daily.    . NON FORMULARY Intestinal Formula, Dr Lennice Sites    . NONFORMULARY OR COMPOUNDED ITEM Bi-Est 5 mg 70 to 30 ratio 2-4 clicks daily. Dispense 1 month supply 0RF. 1 each 0  . NONFORMULARY OR COMPOUNDED ITEM Progesterone SR 200 mg take 1 tablet daily. 30 each 0  . Probiotic Product (PROBIOTIC PO) Take by mouth.    . SYNTHROID 75 MCG tablet Take 1 tablet (75 mcg total) by mouth daily. 30 tablet 1  . UNABLE TO FIND Testosterone 0.25 mg every day (compounded)    . UNABLE TO FIND Quercetine 800 mg po 3 times per day    . VITAMIN D PO Take by mouth. Vit d 5000 2 x daily     No current facility-administered medications for this visit.     Family History  Problem Relation Age  of Onset  . Hypertension Mother   . Osteoporosis Mother   . Osteoarthritis Mother   . Hyperlipidemia Father   . Non-Hodgkin's lymphoma Father   . Hypertension Maternal Grandmother   . Stroke Maternal Grandmother   . Osteoporosis Other   . Colon cancer Neg Hx   . Colon polyps Neg Hx   . Esophageal cancer Neg Hx   . Stomach cancer Neg Hx   . Rectal cancer Neg Hx     Review of Systems  All other systems reviewed and are negative.   Exam:   BP 138/82   Pulse 70   Temp 97.8 F (36.6 C) (Temporal)   Resp 14   Ht 5\' 1"  (1.549 m)   Wt 116 lb 3.2 oz (52.7 kg)   LMP 02/12/2007 (Exact Date)   BMI 21.96 kg/m     General appearance: alert, cooperative and appears stated age Head: normocephalic, without obvious abnormality, atraumatic Neck: no adenopathy, supple, symmetrical, trachea midline and thyroid normal to inspection  and palpation Lungs: clear to auscultation bilaterally Breasts: normal appearance, no masses or tenderness, No nipple retraction or dimpling, No nipple discharge or bleeding, No axillary adenopathy Heart: regular rate and rhythm Abdomen: soft, non-tender; no masses, no organomegaly Extremities: extremities normal, atraumatic, no cyanosis or edema Skin: skin color, texture, turgor normal. No rashes or lesions Lymph nodes: cervical, supraclavicular, and axillary nodes normal. Neurologic: grossly normal  Pelvic: External genitalia:  no lesions              No abnormal inguinal nodes palpated.              Urethra:  normal appearing urethra with no masses, tenderness or lesions              Bartholins and Skenes: normal                 Vagina: normal appearing vagina with normal color and discharge, no lesions.  Minimal cystocele.              Cervix: no lesions              Pap taken: No. Bimanual Exam:  Uterus:  normal size, contour, position, consistency, mobility, non-tender              Adnexa: no mass, fullness, tenderness              Rectal exam: Yes.  .  Confirms.              Anus:  normal sphincter tone, no lesions  Chaperone was present for exam.  Assessment:   Well woman visit with normal exam. On bioidentical HRT. Hx supracervical hysterectomy. Ovaries remain.  Hx positive HR HPV and LGSIL. Osteopenia.  Plan: Mammogram screening discussed. Self breast awareness reviewed. Pap and HR HPV as above.  5 year risk of CIN3 is 0.03%.   Next pap in 2024.  Guidelines for Calcium, Vitamin D, regular exercise program including cardiovascular and weight bearing exercise. BMD in 2022.  Follow up annually and prn.   After visit summary provided.

## 2019-01-13 ENCOUNTER — Telehealth: Payer: Self-pay | Admitting: *Deleted

## 2019-01-13 DIAGNOSIS — Z20822 Contact with and (suspected) exposure to covid-19: Secondary | ICD-10-CM

## 2019-01-13 NOTE — Telephone Encounter (Signed)
Copied from [place] [id number]. Topic: General - Other >> Jan 13, 2019 12:23 PM Alanda Slim E wrote: Reason for CRM: Pt would like to schedule antibody testing/ Pt would like a call to schedule / please advise

## 2019-01-13 NOTE — Telephone Encounter (Signed)
I called pt- she is asymptomatic but wants COVID-19 Ab screening. Lab orders placed. Pt informed and scheduled to come to [facility] lab on 01/20/19. I informed her to call and r/s if she develops any sxs of COVID-19.

## 2019-01-15 ENCOUNTER — Ambulatory Visit: Payer: 59 | Attending: Internal Medicine

## 2019-01-15 DIAGNOSIS — Z20822 Contact with and (suspected) exposure to covid-19: Secondary | ICD-10-CM

## 2019-01-17 LAB — NOVEL CORONAVIRUS, NAA: SARS-CoV-2, NAA: NOT DETECTED

## 2019-01-20 ENCOUNTER — Other Ambulatory Visit: Payer: 59

## 2019-01-22 ENCOUNTER — Other Ambulatory Visit: Payer: Self-pay

## 2019-01-22 ENCOUNTER — Other Ambulatory Visit: Payer: 59

## 2019-01-22 DIAGNOSIS — Z20822 Contact with and (suspected) exposure to covid-19: Secondary | ICD-10-CM

## 2019-01-23 LAB — SAR COV2 SEROLOGY (COVID19)AB(IGG),IA: SARS CoV2 AB IGG: NEGATIVE

## 2019-06-03 ENCOUNTER — Ambulatory Visit (INDEPENDENT_AMBULATORY_CARE_PROVIDER_SITE_OTHER): Payer: 59 | Admitting: Family Medicine

## 2019-06-03 ENCOUNTER — Encounter: Payer: Self-pay | Admitting: Family Medicine

## 2019-06-03 ENCOUNTER — Other Ambulatory Visit: Payer: Self-pay

## 2019-06-03 ENCOUNTER — Ambulatory Visit: Payer: Self-pay

## 2019-06-03 VITALS — BP 110/76 | HR 77 | Ht 61.0 in | Wt 115.0 lb

## 2019-06-03 DIAGNOSIS — M25512 Pain in left shoulder: Secondary | ICD-10-CM | POA: Diagnosis not present

## 2019-06-03 DIAGNOSIS — M25532 Pain in left wrist: Secondary | ICD-10-CM

## 2019-06-03 DIAGNOSIS — M67442 Ganglion, left hand: Secondary | ICD-10-CM | POA: Diagnosis not present

## 2019-06-03 NOTE — Progress Notes (Signed)
Angela [name] Phone: [phone] Subjective:   Angela [name], am serving as a scribe for Dr. Hulan Saas. This visit occurred during the SARS-CoV-2 public health emergency.  Safety protocols were in place, including screening questions prior to the visit, additional usage of staff PPE, and extensive cleaning of exam room while observing appropriate contact time as indicated for disinfecting solutions.   I'm seeing this patient by the request  of:  Binnie Rail, MD  CC: Left shoulder, left wrist  [id number]  Angela [name] is a 64 y.o. female coming in with complaint of wrist and knee pain. Last seen in 2019 for elbow pain. Patient states that she has had a nodule over the right wrist radial side for one year.   Also wanted to see if the popping in her shoulder since suffering an injury 2 years ago. Also having crepitus in knees. Is able to continue being physically active.  Patient states initially had some decrease in range of motion but now is improving.  States though that continues to have discomfort and pain especially when doing too much movement.      Past Medical History:  Diagnosis Date  . Abnormal Pap smear of cervix 07-31-13   --pt. 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 Allergy   . Arthritis   . Chicken pox   . Heart murmur   . Osteopenia   . Thyroid disease    Past Surgical History:  Procedure Laterality Date  . ABDOMINAL HYSTERECTOMY     fibroids  . COLONOSCOPY    . PILONIDAL CYST DRAINAGE  age 89  . ROBOTIC ASSISTED SUPRACERVICAL HYSTERECTOMY  02/12/07   Adenomyosis and fibroids; Ovaries remain; [place]  . TONSILLECTOMY AND ADENOIDECTOMY  child   Social History   Socioeconomic History  . Marital status: Married    Spouse name: Not on file  . Number of children: Not on file  . Years of education: Not on file  . Highest  education level: Not on file  Occupational History  . Not on file  Tobacco Use  . Smoking status: Never Smoker  . Smokeless tobacco: Never Used  Substance and Sexual Activity  . Alcohol use: Yes    Alcohol/week: 7.0 standard drinks    Types: 7 Glasses of wine per week  . Drug use: [name]  . Sexual activity: Yes    Partners: Male    Birth control/protection: Surgical    Comment: hysterectomy  Other Topics Concern  . Not on file  Social History Narrative  . Not on file   Social Determinants of Health   Financial Resource Strain:   . Difficulty of Paying Living Expenses:   Food Insecurity:   . Worried About Charity fundraiser in the Last Year:   . Arboriculturist in the Last Year:   Transportation Needs:   . Film/video editor (Medical):   Marland Kitchen Lack of Transportation (Non-Medical):   Physical Activity:   . Days of Exercise per Week:   . Minutes of Exercise per Session:   Stress:   . Feeling of Stress :   Social Connections:   . Frequency of Communication with Friends and Family:   . Frequency of Social Gatherings with Friends and Family:   . Attends Religious Services:   . Active Member of Clubs or Organizations:   . Attends Archivist Meetings:   .  Marital Status:    [name] Known Allergies Family History  Problem Relation Age of Onset  . Hypertension Mother   . Osteoporosis Mother   . Osteoarthritis Mother   . Hyperlipidemia Father   . Non-Hodgkin's lymphoma Father   . Hypertension Maternal Grandmother   . Stroke Maternal Grandmother   . Osteoporosis Other   . Colon cancer Neg Hx   . Colon polyps Neg Hx   . Esophageal cancer Neg Hx   . Stomach cancer Neg Hx   . Rectal cancer Neg Hx     Current Outpatient Medications (Endocrine & Metabolic):  .  liothyronine (CYTOMEL) 5 MCG tablet, Take 1 tablet (5 mcg total) by mouth daily. Marland Kitchen  SYNTHROID 75 MCG tablet, Take 1 tablet (75 mcg total) by mouth daily.      Current Outpatient Medications (Other):  .  b  complex vitamins tablet, Take 1 tablet by mouth daily. .  CHROMIUM PICOLINATE PO, Take by mouth. .  fish oil-omega-3 fatty acids 1000 MG capsule, Take 2 g by mouth daily. .  Multiple Vitamin (MULTIVITAMIN) tablet, Take 1 tablet by mouth daily. .  Multiple Vitamins-Minerals (ZINC PO), Take 1 tablet by mouth daily. .  NON FORMULARY, Intestinal Formula, Dr Lennice Sites .  NONFORMULARY OR COMPOUNDED ITEM, Bi-Est 5 mg 70 to 30 ratio 2-4 clicks daily. Dispense 1 month supply 0RF. Marland Kitchen  NONFORMULARY OR COMPOUNDED ITEM, Progesterone SR 200 mg take 1 tablet daily. .  Probiotic Product (PROBIOTIC PO), Take by mouth. Marland Kitchen  UNABLE TO FIND, Testosterone 0.25 mg every day (compounded) .  UNABLE TO FIND, Quercetine 800 mg po 3 times per day .  VITAMIN D PO, Take by mouth. Vit d 5000 2 x daily   Reviewed prior external information including notes and imaging from  primary care provider As well as notes that were available from care everywhere and other healthcare systems.  Past medical history, social, surgical and family history all reviewed in electronic medical record.  [name] pertanent information unless stated regarding to the chief complaint.   Review of Systems:  [name] headache, visual changes, nausea, vomiting, diarrhea, constipation, dizziness, abdominal pain, skin rash, fevers, chills, night sweats, weight loss, swollen lymph nodes, body aches, joint swelling, chest pain, shortness of breath, mood changes. POSITIVE muscle aches  Objective  Blood pressure 110/76, pulse 77, height 5\' 1"  (1.549 m), weight 115 lb (52.2 kg), last menstrual period 02/12/2007, SpO2 99 %.   General: [name] apparent distress alert and oriented x3 mood and affect normal, dressed appropriately.  HEENT: Pupils equal, extraocular movements intact  Respiratory: Patient's speak in full sentences and does not appear short of breath  Cardiovascular: [name] lower extremity edema, non tender, [name] erythema  Neuro: Cranial nerves II through XII are  intact, neurovascularly intact in all extremities with 2+ DTRs and 2+ pulses.  Gait normal with good balance and coordination.  MSK:  Left wrist exam shows the patient does have a large cyst noted just proximal to the [facility] joint.  Seems to be more ganglion.  Fairly movable, minorly tender.  Appears to be outside the tendon sheath with movement.  Negative Finkelstein's  Left shoulder exam has some very mild positive impingement.  5 out of 5 strength of the rotator cuff.  Near full range of motion compared to the contralateral side.  Limited musculoskeletal ultrasound was performed and interpreted by Lyndal Pulley  Limited ultrasound of patient's left shoulder seems that there is some mild thickening of the anterior capsule noted  with calcific changes of the subacromial bursa.  [name] true tear of the rotator cuff appreciated.  Left wrist ultrasound shows the patient does have what appears to be a ganglion cyst.  Seems to be superficial to the tendon sheath.  [name] abnormal vascularity  Procedure: Real-time Ultrasound Guided Injection of left wrist ganglion cyst Device: GE Logiq Q7 Ultrasound guided injection is preferred based studies that show increased duration, increased effect, greater accuracy, decreased procedural pain, increased response rate, and decreased cost with ultrasound guided versus blind injection.  Verbal informed consent obtained.  Time-out conducted.  Noted [name] overlying erythema, induration, or other signs of local infection.  Skin prepped in a sterile fashion.  Local anesthesia: Topical Ethyl chloride.  With sterile technique and under real time ultrasound guidance: With a 25-gauge half inch needle injected with 0.5 cc of 0.5% Marcaine and then expressed significant amount of gel-like fluid from the area.  Then injected 0.5 cc of Kenalog 40 mg/mL Completed without difficulty  Pain immediately resolved suggesting accurate placement of the medication.  Advised to call if  fevers/chills, erythema, induration, drainage, or persistent bleeding.  Images permanently stored and available for review in the ultrasound unit.  Impression: Technically successful ultrasound guided injection.   Impression and Recommendations:     This case required medical decision making of moderate complexity. The above documentation has been reviewed and is accurate and complete Lyndal Pulley, DO       Note: This dictation was prepared with Dragon dictation along with smaller phrase technology. Any transcriptional errors that result from this process are unintentional.

## 2019-06-03 NOTE — Patient Instructions (Signed)
[place] Ice Shoulder ex See me in 6 weeks

## 2019-06-03 NOTE — Assessment & Plan Note (Signed)
Quick ultrasound shows the patient does have a calcific bursitis noted.  Could be actually the end of a frozen shoulder move her patient stating that did have a decreased range of motion and is improving.  Home exercises given.  Topical anti-inflammatories prescribed.  Follow-up again in 4 to 8 weeks worsening symptoms consider injection and formal physical therapy

## 2019-06-03 NOTE — Assessment & Plan Note (Signed)
Aspiration done today.  Tolerated the procedure well.  Discussed potential compression.  Discussed the potential for to reaccumulate.  Icing regimen, home exercise, follow-up again in 4 to 6 weeks

## 2019-07-16 ENCOUNTER — Ambulatory Visit: Payer: 59 | Admitting: Family Medicine

## 2019-08-13 ENCOUNTER — Other Ambulatory Visit: Payer: Self-pay

## 2019-08-13 ENCOUNTER — Encounter: Payer: Self-pay | Admitting: Family Medicine

## 2019-08-13 ENCOUNTER — Ambulatory Visit (INDEPENDENT_AMBULATORY_CARE_PROVIDER_SITE_OTHER): Payer: 59 | Admitting: Family Medicine

## 2019-08-13 DIAGNOSIS — M67442 Ganglion, left hand: Secondary | ICD-10-CM

## 2019-08-13 NOTE — Progress Notes (Signed)
[place] [place] Phone: [phone] Subjective:   Angela [name], am serving as a scribe for Dr. Hulan Saas. This visit occurred during the SARS-CoV-2 public health emergency.  Safety protocols were in place, including screening questions prior to the visit, additional usage of staff PPE, and extensive cleaning of exam room while observing appropriate contact time as indicated for disinfecting solutions.   I'm seeing this patient by the request  of:  Binnie Rail, MD  CC: Wrist initial pain follow-up  URK:[id number]   06/03/2019 Quick ultrasound shows the patient does have a calcific bursitis noted.  Could be actually the end of a frozen shoulder move her patient stating that did have a decreased range of motion and is improving.  Home exercises given.  Topical anti-inflammatories prescribed.  Follow-up again in 4 to 8 weeks worsening symptoms consider injection and formal physical therapy  Aspiration done today.  Tolerated the procedure well.  Discussed potential compression.  Discussed the potential for to reaccumulate.  Icing regimen, home exercise, follow-up again in 4 to 6 weeks  Update 08/13/2019 Angela [name] is a 64 y.o. female coming in with complaint of left shoulder and left wrist, ganglion cyst. Patient states that wrist pain has improved. Cyst is filling back up but is not painful. States left shoulder pain has subsided.       Past Medical History:  Diagnosis Date  . Abnormal Pap smear of cervix 07-31-13   --pt. 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 Allergy   . Arthritis   . Chicken pox   . Heart murmur   . Osteopenia   . Thyroid disease    Past Surgical History:  Procedure Laterality Date  . ABDOMINAL HYSTERECTOMY     fibroids  . COLONOSCOPY    . PILONIDAL CYST DRAINAGE  age 72  . ROBOTIC ASSISTED SUPRACERVICAL HYSTERECTOMY  02/12/07   Adenomyosis  and fibroids; Ovaries remain; [place]  . TONSILLECTOMY AND ADENOIDECTOMY  child   Social History   Socioeconomic History  . Marital status: Married    Spouse name: Not on file  . Number of children: Not on file  . Years of education: Not on file  . Highest education level: Not on file  Occupational History  . Not on file  Tobacco Use  . Smoking status: Never Smoker  . Smokeless tobacco: Never Used  Vaping Use  . Vaping Use: Never used  Substance and Sexual Activity  . Alcohol use: Yes    Alcohol/week: 7.0 standard drinks    Types: 7 Glasses of wine per week  . Drug use: [name]  . Sexual activity: Yes    Partners: Male    Birth control/protection: Surgical    Comment: hysterectomy  Other Topics Concern  . Not on file  Social History Narrative  . Not on file   Social Determinants of Health   Financial Resource Strain:   . Difficulty of Paying Living Expenses:   Food Insecurity:   . Worried About Charity fundraiser in the Last Year:   . Arboriculturist in the Last Year:   Transportation Needs:   . Film/video editor (Medical):   Marland Kitchen Lack of Transportation (Non-Medical):   Physical Activity:   . Days of Exercise per Week:   . Minutes of Exercise per Session:   Stress:   . Feeling of Stress :   Social Connections:   .  Frequency of Communication with Friends and Family:   . Frequency of Social Gatherings with Friends and Family:   . Attends Religious Services:   . Active Member of Clubs or Organizations:   . Attends Archivist Meetings:   Marland Kitchen Marital Status:    [name] Known Allergies Family History  Problem Relation Age of Onset  . Hypertension Mother   . Osteoporosis Mother   . Osteoarthritis Mother   . Hyperlipidemia Father   . Non-Hodgkin's lymphoma Father   . Hypertension Maternal Grandmother   . Stroke Maternal Grandmother   . Osteoporosis Other   . Colon cancer Neg Hx   . Colon polyps Neg Hx   . Esophageal cancer Neg Hx   . Stomach  cancer Neg Hx   . Rectal cancer Neg Hx     Current Outpatient Medications (Endocrine & Metabolic):  .  liothyronine (CYTOMEL) 5 MCG tablet, Take 1 tablet (5 mcg total) by mouth daily. Marland Kitchen  SYNTHROID 75 MCG tablet, Take 1 tablet (75 mcg total) by mouth daily.      Current Outpatient Medications (Other):  .  b complex vitamins tablet, Take 1 tablet by mouth daily. .  CHROMIUM PICOLINATE PO, Take by mouth. .  fish oil-omega-3 fatty acids 1000 MG capsule, Take 2 g by mouth daily. .  Multiple Vitamin (MULTIVITAMIN) tablet, Take 1 tablet by mouth daily. .  Multiple Vitamins-Minerals (ZINC PO), Take 1 tablet by mouth daily. .  NON FORMULARY, Intestinal Formula, Dr Lennice Sites .  NONFORMULARY OR COMPOUNDED ITEM, Bi-Est 5 mg 70 to 30 ratio 2-4 clicks daily. Dispense 1 month supply 0RF. Marland Kitchen  NONFORMULARY OR COMPOUNDED ITEM, Progesterone SR 200 mg take 1 tablet daily. .  Probiotic Product (PROBIOTIC PO), Take by mouth. Marland Kitchen  UNABLE TO FIND, Testosterone 0.25 mg every day (compounded) .  UNABLE TO FIND, Quercetine 800 mg po 3 times per day .  VITAMIN D PO, Take by mouth. Vit d 5000 2 x daily   Reviewed prior external information including notes and imaging from  primary care provider As well as notes that were available from care everywhere and other healthcare systems.  Past medical history, social, surgical and family history all reviewed in electronic medical record.  [name] pertanent information unless stated regarding to the chief complaint.   Review of Systems:  [name] headache, visual changes, nausea, vomiting, diarrhea, constipation, dizziness, abdominal pain, skin rash, fevers, chills, night sweats, weight loss, swollen lymph nodes, body aches, joint swelling, chest pain, shortness of breath, mood changes. POSITIVE muscle aches  Objective  Blood pressure 124/72, pulse 82, height 5\' 1"  (1.549 m), weight 116 lb (52.6 kg), last menstrual period 02/12/2007, SpO2 99 %.   General: [name] apparent distress  alert and oriented x3 mood and affect normal, dressed appropriately.  HEENT: Pupils equal, extraocular movements intact  Respiratory: Patient's speak in full sentences and does not appear short of breath  Cardiovascular: [name] lower extremity edema, non tender, [name] erythema  Neuro: Cranial nerves II through XII are intact, neurovascularly intact in all extremities with 2+ DTRs and 2+ pulses.  Gait normal with good balance and coordination.  MSK: Left wrist exam shows the patient is doing well since his minorly reaccumulating but significantly smaller than previous exam.  Patient has full range of motion.  Good grip strength.  Nontender with freely movable cyst radial aspect of the wrist    Impression and Recommendations:     The above documentation has been reviewed and is accurate and  complete Lyndal Pulley, DO       Note: This dictation was prepared with Dragon dictation along with smaller phrase technology. Any transcriptional errors that result from this process are unintentional.

## 2019-08-13 NOTE — Assessment & Plan Note (Signed)
Patient is doing very well at this time.  We discussed the possibility of repeating the aspiration but now it is secondary to more of the appearance.  We discussed doing it again but at this moment with her not hurting her I encouraged her to use it more as needed.  Patient will follow up with me as needed

## 2019-11-04 ENCOUNTER — Ambulatory Visit: Payer: 59 | Admitting: Family Medicine

## 2019-11-26 ENCOUNTER — Ambulatory Visit: Payer: 59 | Admitting: Family Medicine

## 2019-12-24 ENCOUNTER — Other Ambulatory Visit: Payer: Self-pay | Admitting: Internal Medicine

## 2019-12-24 DIAGNOSIS — Z1231 Encounter for screening mammogram for malignant neoplasm of breast: Secondary | ICD-10-CM

## 2020-01-28 ENCOUNTER — Ambulatory Visit: Payer: 59 | Admitting: Obstetrics and Gynecology

## 2020-02-06 ENCOUNTER — Ambulatory Visit
Admission: RE | Admit: 2020-02-06 | Discharge: 2020-02-06 | Disposition: A | Discharge status: Home / Self Care | Payer: 59 | Source: Ambulatory Visit | Attending: Internal Medicine | Admitting: Internal Medicine

## 2020-02-06 ENCOUNTER — Other Ambulatory Visit: Payer: Self-pay

## 2020-02-06 DIAGNOSIS — Z1231 Encounter for screening mammogram for malignant neoplasm of breast: Secondary | ICD-10-CM

## 2020-02-09 ENCOUNTER — Ambulatory Visit (INDEPENDENT_AMBULATORY_CARE_PROVIDER_SITE_OTHER): Payer: 59 | Admitting: Obstetrics and Gynecology

## 2020-02-09 ENCOUNTER — Encounter: Payer: Self-pay | Admitting: Obstetrics and Gynecology

## 2020-02-09 ENCOUNTER — Other Ambulatory Visit: Payer: Self-pay

## 2020-02-09 VITALS — BP 110/72 | HR 90 | Ht 61.0 in | Wt 118.0 lb

## 2020-02-09 DIAGNOSIS — Z780 Asymptomatic menopausal state: Secondary | ICD-10-CM

## 2020-02-09 DIAGNOSIS — Z01419 Encounter for gynecological examination (general) (routine) without abnormal findings: Secondary | ICD-10-CM | POA: Diagnosis not present

## 2020-02-09 NOTE — Progress Notes (Signed)
65 y.o. [id number] Married Caucasian female here for annual exam.    Can feel her bladder has dropped.  Symptoms come and go. No urinary leakage.  States she is voiding well.  Good bowel function.   On bioidentical HRT with Darron Doom.   Taking testosterone also.  No vaginal bleeding.  Wants to continue.  Some vaginal dryness.   Family growing.   Received her Covid vaccine.  No booster.   PCP:  Billey Gosling, MD   Patient's last menstrual period was 02/12/2007 (exact date).           Sexually active: Yes.    The current method of family planning is status post hysterectomy.    Exercising: Yes.    spinning, tennis and strength Smoker:  no  Health Maintenance: Pap:  10/04/17 Neg:Neg HR HPV, 08/22/2018Neg:Neg HR HPV History of abnormal Pap:  Yes, 08-21-14 ASCUS:Pos HR HPV with colposcopy revealing atypia on exocervical bx and Neg ECC. MMG:  02-06-20 3D/Neg/Birads1 Colonoscopy:  03/13/18 Normal f/u 10 years BMD: 10-03-18  Result :Osteopenia left hip and spine TDaP: 04-22-12 Gardasil:   n/a HIV:no Hep C:no Screening Labs:  PCP and Darron Doom.    reports that she has never smoked. She has never used smokeless tobacco. She reports current alcohol use of about 7.0 standard drinks of alcohol per week. She reports that she does not use drugs.  Past Medical History:  Diagnosis Date   Abnormal Pap smear of cervix 07-31-13   --pt. 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   Allergy    Arthritis    Chicken pox    Heart murmur    Osteopenia    Thyroid disease     Past Surgical History:  Procedure Laterality Date   ABDOMINAL HYSTERECTOMY     fibroids   COLONOSCOPY     PILONIDAL CYST DRAINAGE  age 20   ROBOTIC ASSISTED SUPRACERVICAL HYSTERECTOMY  02/12/07   Adenomyosis and fibroids; Ovaries remain; [place]  child    Current Outpatient Medications  Medication Sig Dispense Refill   b complex  vitamins tablet Take 1 tablet by mouth daily.     fish oil-omega-3 fatty acids 1000 MG capsule Take 2 g by mouth daily.     Multiple Vitamin (MULTIVITAMIN) tablet Take 1 tablet by mouth daily.     Multiple Vitamins-Minerals (ZINC PO) Take 1 tablet by mouth daily.     NONFORMULARY OR COMPOUNDED ITEM Bi-Est 5 mg 70 to 30 ratio 2-4 clicks daily. Dispense 1 month supply 0RF. 1 each 0   NONFORMULARY OR COMPOUNDED ITEM Progesterone SR 200 mg take 1 tablet daily. 30 each 0   Probiotic Product (PROBIOTIC PO) Take by mouth.     SYNTHROID 75 MCG tablet Take 1 tablet (75 mcg total) by mouth daily. 30 tablet 1   UNABLE TO FIND Testosterone 0.25 mg every day (compounded)     UNABLE TO FIND Quercetine 800 mg po 3 times per day     VITAMIN D PO Take by mouth. Vit d 5000 2 x daily     No current facility-administered medications for this visit.    Family History  Problem Relation Age of Onset   Hypertension Mother    Osteoporosis Mother    Osteoarthritis Mother    Hyperlipidemia Father    Non-Hodgkin's lymphoma Father    Hypertension Maternal Grandmother    Stroke Maternal Grandmother    Osteoporosis Other    Colon cancer  Neg Hx    Colon polyps Neg Hx    Esophageal cancer Neg Hx    Stomach cancer Neg Hx    Rectal cancer Neg Hx     Review of Systems  All other systems reviewed and are negative.   Exam:   BP 110/72    Pulse 90    Ht 5\' 1"  (1.549 m)    Wt 118 lb (53.5 kg)    LMP 02/12/2007 (Exact Date)    SpO2 99%    BMI 22.30 kg/m     General appearance: alert, cooperative and appears stated age Head: normocephalic, without obvious abnormality, atraumatic Neck: no adenopathy, supple, symmetrical, trachea midline and thyroid normal to inspection and palpation Lungs: clear to auscultation bilaterally Breasts: normal appearance, no masses or tenderness, No nipple retraction or dimpling, No nipple discharge or bleeding, No axillary adenopathy Heart: regular rate and  rhythm Abdomen: soft, non-tender; no masses, no organomegaly Extremities: extremities normal, atraumatic, no cyanosis or edema Skin: skin color, texture, turgor normal. No rashes or lesions Lymph nodes: cervical, supraclavicular, and axillary nodes normal. Neurologic: grossly normal  Pelvic: External genitalia:  no lesions              No abnormal inguinal nodes palpated.              Urethra:  normal appearing urethra with no masses, tenderness or lesions              Bartholins and Skenes: normal                 Vagina: normal appearing vagina with normal color and discharge, no lesions              Cervix: no lesions              Pap taken: No. Bimanual Exam:  Uterus:  absent              Adnexa: no mass, fullness, tenderness              Rectal exam: Yes.  .  Confirms.              Anus:  normal sphincter tone, no lesions  Chaperone was present for exam.  Assessment:   Well woman visit with normal exam. Menopause. On bioidentical HRT and testosterone therapy. Hx supracervical hysterectomy. Ovaries remain.  Hx prior positive HR HPV and LGSIL. Osteopenia.  Plan: Mammogram screening discussed. Self breast awareness reviewed. Pap and HR HPV next year.  Guidelines for Calcium, Vitamin D, regular exercise program including cardiovascular and weight bearing exercise. BMD.  Follow up annually and prn.

## 2020-02-09 NOTE — Patient Instructions (Signed)

## 2020-02-11 ENCOUNTER — Ambulatory Visit: Payer: Self-pay

## 2020-02-11 ENCOUNTER — Other Ambulatory Visit: Payer: Self-pay

## 2020-02-11 ENCOUNTER — Ambulatory Visit (INDEPENDENT_AMBULATORY_CARE_PROVIDER_SITE_OTHER): Payer: 59

## 2020-02-11 ENCOUNTER — Encounter: Payer: Self-pay | Admitting: Family Medicine

## 2020-02-11 ENCOUNTER — Ambulatory Visit (INDEPENDENT_AMBULATORY_CARE_PROVIDER_SITE_OTHER): Payer: 59 | Admitting: Family Medicine

## 2020-02-11 VITALS — BP 122/82 | HR 66 | Ht 61.0 in | Wt 119.0 lb

## 2020-02-11 DIAGNOSIS — M25532 Pain in left wrist: Secondary | ICD-10-CM | POA: Diagnosis not present

## 2020-02-11 DIAGNOSIS — M67442 Ganglion, left hand: Secondary | ICD-10-CM | POA: Diagnosis not present

## 2020-02-11 NOTE — Assessment & Plan Note (Addendum)
Patient did respond significantly well previously.  This time though there was some more synovial fluid eventually ganglion fluid.  Patient has been doing a lot more activity with her granddaughter that likely contributed this time.  If it does seem to come back sooner we do need to consider potential surgical intervention.  Patient has significant amount of inflammation but no significant neovascularization and no true mass.  No cortical irregularity of the bone noted patient will follow up with me again in 4 weeks.  We did discuss potential referral to hand surgery today but patient declined with her traveling out of state as well as has been going to be having surgery so wanted to try aspiration 1 more time.

## 2020-02-11 NOTE — Patient Instructions (Signed)
Drained the cyst one more time See Dr. Grandville Silos if it returns Write me  If things get worse at any point see me again  See me again in 6 weeks

## 2020-02-11 NOTE — Progress Notes (Signed)
[facility] Phone: [phone] Subjective:   Angela [name], am serving as a scribe for Dr. Hulan Saas. This visit occurred during the SARS-CoV-2 public health emergency.  Safety protocols were in place, including screening questions prior to the visit, additional usage of staff PPE, and extensive cleaning of exam room while observing appropriate contact time as indicated for disinfecting solutions.   I'm seeing this patient by the request  of:  Binnie Rail, MD  CC: Left hand cyst follow-up  QA:[id number]   08/13/2019 Patient is doing very well at this time.  We discussed the possibility of repeating the aspiration but now it is secondary to more of the appearance.  We discussed doing it again but at this moment with her not hurting her I encouraged her to use it more as needed.  Patient will follow up with me as needed  Update 02/11/2020 Angela [name] is a 65 y.o. female coming in with complaint of left wrist, ganglion cyst. Patient states that her pain radiates up her arm intermittently. Patent noticed after holding her 67 month old granddaughter that swelling increased.  Patient did have what appeared to be more of a ganglion cyst of the tendon sheath of the left hand previously.  Patient states that it started to reaccumulate again.  Patient states that it gets bigger and smaller depending on activity.  Patient states recently it is stated age and is giving her more discomfort.  Denies any fevers, chills, any abnormal weight loss.  Just more uncomfortable than anything else     Past Medical History:  Diagnosis Date   Abnormal Pap smear of cervix 07-31-13   --pt. 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   Allergy    Arthritis    Chicken pox    Heart murmur    Osteopenia    Thyroid disease    Past Surgical History:  Procedure Laterality Date   ABDOMINAL  HYSTERECTOMY     fibroids   COLONOSCOPY     PILONIDAL CYST DRAINAGE  age 27   ROBOTIC ASSISTED SUPRACERVICAL HYSTERECTOMY  02/12/07   Adenomyosis and fibroids; Ovaries remain; [place]  child   Social History   Socioeconomic History   Marital status: Married    Spouse name: Not on file   Number of children: Not on file   Years of education: Not on file   Highest education level: Not on file  Occupational History   Not on file  Tobacco Use   Smoking status: Never Smoker   Smokeless tobacco: Never Used  Vaping Use   Vaping Use: Never used  Substance and Sexual Activity   Alcohol use: Yes    Alcohol/week: 7.0 standard drinks    Types: 7 Glasses of wine per week   Drug use: [name]   Sexual activity: Yes    Partners: Male    Birth control/protection: Surgical    Comment: hysterectomy  Other Topics Concern   Not on file  Social History Narrative   Not on file   Social Determinants of Health   Financial Resource Strain: Not on file  Food Insecurity: Not on file  Transportation Needs: Not on file  Physical Activity: Not on file  Stress: Not on file  Social Connections: Not on file   [name] Known Allergies Family History  Problem Relation Age of Onset   Hypertension Mother    Osteoporosis  Mother    Osteoarthritis Mother    Hyperlipidemia Father    Non-Hodgkin's lymphoma Father    Hypertension Maternal Grandmother    Stroke Maternal Grandmother    Osteoporosis Other    Colon cancer Neg Hx    Colon polyps Neg Hx    Esophageal cancer Neg Hx    Stomach cancer Neg Hx    Rectal cancer Neg Hx     Current Outpatient Medications (Endocrine & Metabolic):    SYNTHROID 75 MCG tablet, Take 1 tablet (75 mcg total) by mouth daily.      Current Outpatient Medications (Other):    b complex vitamins tablet, Take 1 tablet by mouth daily.   fish oil-omega-3 fatty acids 1000 MG capsule, Take 2 g by mouth  daily.   Multiple Vitamin (MULTIVITAMIN) tablet, Take 1 tablet by mouth daily.   Multiple Vitamins-Minerals (ZINC PO), Take 1 tablet by mouth daily.   NONFORMULARY OR COMPOUNDED ITEM, Bi-Est 5 mg 70 to 30 ratio 2-4 clicks daily. Dispense 1 month supply 0RF.   NONFORMULARY OR COMPOUNDED ITEM, Progesterone SR 200 mg take 1 tablet daily.   Probiotic Product (PROBIOTIC PO), Take by mouth.   UNABLE TO FIND, Testosterone 0.25 mg every day (compounded)   UNABLE TO FIND, Quercetine 800 mg po 3 times per day   VITAMIN D PO, Take by mouth. Vit d 5000 2 x daily   Reviewed prior external information including notes and imaging from  primary care provider As well as notes that were available from care everywhere and other healthcare systems.  Past medical history, social, surgical and family history all reviewed in electronic medical record.  [name] pertanent information unless stated regarding to the chief complaint.   Review of Systems:  [name] headache, visual changes, nausea, vomiting, diarrhea, constipation, dizziness, abdominal pain, skin rash, fevers, chills, night sweats, weight loss, swollen lymph nodes, body aches, joint swelling, chest pain, shortness of breath, mood changes. POSITIVE muscle aches  Objective  Blood pressure 122/82, pulse 66, height 5\' 1"  (1.549 m), weight 119 lb (54 kg), last menstrual period 02/12/2007, SpO2 99 %.   General: [name] apparent distress alert and oriented x3 mood and affect normal, dressed appropriately.  HEENT: Pupils equal, extraocular movements intact  Respiratory: Patient's speak in full sentences and does not appear short of breath  Cardiovascular: [name] lower extremity edema, non tender, [name] erythema  Gait normal with good balance and coordination.  MSK: Left wrist exam shows the patient does have a very lobulated cyst noted more in the abductor pollicis longus approximately 1 cm proximal to the distal radius there.  Mildly tender to palpation.  [name] erythema  noted.  Good range of motion of the wrist noted.  Procedure: Real-time Ultrasound Guided Injection of left tendon sheath cyst Device: GE Logiq Q7 Ultrasound guided injection is preferred based studies that show increased duration, increased effect, greater accuracy, decreased procedural pain, increased response rate, and decreased cost with ultrasound guided versus blind injection.  Verbal informed consent obtained.  Time-out conducted.  Noted [name] overlying erythema, induration, or other signs of local infection.  Skin prepped in a sterile fashion.  Local anesthesia: Topical Ethyl chloride.  With sterile technique and under real time ultrasound guidance: With an 18-gauge 1-1/2 inch needle injected with 0.5 cc of 0.5% Marcaine and aspirated synovial fluid and then gel-like material.  Patient was injected with 0.5 cc of 40 mg/mL of Kenalog. Completed without difficulty  Pain immediately resolved suggesting accurate placement of the medication.  Advised to call if fevers/chills, erythema, induration, drainage, or persistent bleeding.  Impression: Technically successful ultrasound guided injection.    Impression and Recommendations:     The above documentation has been reviewed and is accurate and complete Lyndal Pulley, DO

## 2020-03-16 NOTE — Patient Instructions (Signed)
Blood work was ordered.     Medications changes include :   none     Please followup in 1 year    Health Maintenance, Female Adopting a healthy lifestyle and getting preventive care are important in promoting health and wellness. Ask your health care provider about:  The right schedule for you to have regular tests and exams.  Things you can do on your own to prevent diseases and keep yourself healthy. What should I know about diet, weight, and exercise? Eat a healthy diet  Eat a diet that includes plenty of vegetables, fruits, low-fat dairy products, and lean protein.  Do not eat a lot of foods that are high in solid fats, added sugars, or sodium.   Maintain a healthy weight Body mass index (BMI) is used to identify weight problems. It estimates body fat based on height and weight. Your health care provider can help determine your BMI and help you achieve or maintain a healthy weight. Get regular exercise Get regular exercise. This is one of the most important things you can do for your health. Most adults should:  Exercise for at least 150 minutes each week. The exercise should increase your heart rate and make you sweat (moderate-intensity exercise).  Do strengthening exercises at least twice a week. This is in addition to the moderate-intensity exercise.  Spend less time sitting. Even light physical activity can be beneficial. Watch cholesterol and blood lipids Have your blood tested for lipids and cholesterol at 64 years of age, then have this test every 5 years. Have your cholesterol levels checked more often if:  Your lipid or cholesterol levels are high.  You are older than 64 years of age.  You are at high risk for heart disease. What should I know about cancer screening? Depending on your health history and family history, you may need to have cancer screening at various ages. This may include screening for:  Breast cancer.  Cervical cancer.  Colorectal  cancer.  Skin cancer.  Lung cancer. What should I know about heart disease, diabetes, and high blood pressure? Blood pressure and heart disease  High blood pressure causes heart disease and increases the risk of stroke. This is more likely to develop in people who have high blood pressure readings, are of African descent, or are overweight.  Have your blood pressure checked: ? Every 3-5 years if you are 18-39 years of age. ? Every year if you are 40 years old or older. Diabetes Have regular diabetes screenings. This checks your fasting blood sugar level. Have the screening done:  Once every three years after age 40 if you are at a normal weight and have a low risk for diabetes.  More often and at a younger age if you are overweight or have a high risk for diabetes. What should I know about preventing infection? Hepatitis B If you have a higher risk for hepatitis B, you should be screened for this virus. Talk with your health care provider to find out if you are at risk for hepatitis B infection. Hepatitis C Testing is recommended for:  Everyone born from 1945 through 1965.  Anyone with known risk factors for hepatitis C. Sexually transmitted infections (STIs)  Get screened for STIs, including gonorrhea and chlamydia, if: ? You are sexually active and are younger than 64 years of age. ? You are older than 64 years of age and your health care provider tells you that you are at risk for this type of   infection. ? Your sexual activity has changed since you were last screened, and you are at increased risk for chlamydia or gonorrhea. Ask your health care provider if you are at risk.  Ask your health care provider about whether you are at high risk for HIV. Your health care provider may recommend a prescription medicine to help prevent HIV infection. If you choose to take medicine to prevent HIV, you should first get tested for HIV. You should then be tested every 3 months for as long as  you are taking the medicine. Pregnancy  If you are about to stop having your period (premenopausal) and you may become pregnant, seek counseling before you get pregnant.  Take 400 to 800 micrograms (mcg) of folic acid every day if you become pregnant.  Ask for birth control (contraception) if you want to prevent pregnancy. Osteoporosis and menopause Osteoporosis is a disease in which the bones lose minerals and strength with aging. This can result in bone fractures. If you are 65 years old or older, or if you are at risk for osteoporosis and fractures, ask your health care provider if you should:  Be screened for bone loss.  Take a calcium or vitamin D supplement to lower your risk of fractures.  Be given hormone replacement therapy (HRT) to treat symptoms of menopause. Follow these instructions at home: Lifestyle  Do not use any products that contain nicotine or tobacco, such as cigarettes, e-cigarettes, and chewing tobacco. If you need help quitting, ask your health care provider.  Do not use street drugs.  Do not share needles.  Ask your health care provider for help if you need support or information about quitting drugs. Alcohol use  Do not drink alcohol if: ? Your health care provider tells you not to drink. ? You are pregnant, may be pregnant, or are planning to become pregnant.  If you drink alcohol: ? Limit how much you use to 0-1 drink a day. ? Limit intake if you are breastfeeding.  Be aware of how much alcohol is in your drink. In the [place] one drink equals one 12 oz bottle of beer (355 mL), one 5 oz glass of wine (148 mL), or one 1 oz glass of hard liquor (44 mL). General instructions  Schedule regular health, dental, and eye exams.  Stay current with your vaccines.  Tell your health care provider if: ? You often feel depressed. ? You have ever been abused or do not feel safe at home. Summary  Adopting a healthy lifestyle and getting preventive care are  important in promoting health and wellness.  Follow your health care provider's instructions about healthy diet, exercising, and getting tested or screened for diseases.  Follow your health care provider's instructions on monitoring your cholesterol and blood pressure. This information is not intended to replace advice given to you by your health care provider. Make sure you discuss any questions you have with your health care provider. Document Revised: 12/26/2017 Document Reviewed: 12/26/2017 Elsevier Patient Education  2021 [employer]  

## 2020-03-16 NOTE — Progress Notes (Addendum)
Subjective:    Patient ID: Angela [name], female    DOB: 1955-11-27, 65 y.o.   MRN: [id number]   This visit occurred during the SARS-CoV-2 public health emergency.  Safety protocols were in place, including screening questions prior to the visit, additional usage of staff PPE, and extensive cleaning of exam room while observing appropriate contact time as indicated for disinfecting solutions.    HPI She is here for a physical exam.   She denies changes in her health. Overall she feels well.   Medications and allergies reviewed with patient and updated if appropriate.  Patient Active Problem List   Diagnosis Date Noted  . Ganglion cyst of tendon sheath of left hand 06/03/2019  . Left shoulder pain 06/03/2019  . Hypothyroidism 03/19/2018  . Lung nodule, stable 2009-2012, no FU needed 10/02/2007  . SYMPTOMATIC MENOPAUSAL/FEMALE CLIMACTERIC STATES 10/02/2007  . Allergic rhinitis 10/24/2006  . Osteopenia 10/24/2006    Current Outpatient Medications on File Prior to Visit  Medication Sig Dispense Refill  . b complex vitamins tablet Take 1 tablet by mouth daily.    . fish oil-omega-3 fatty acids 1000 MG capsule Take 2 g by mouth daily.    . Multiple Vitamin (MULTIVITAMIN) tablet Take 1 tablet by mouth daily.    . Multiple Vitamins-Minerals (ZINC PO) Take 1 tablet by mouth daily.    . NONFORMULARY OR COMPOUNDED ITEM Bi-Est 5 mg 70 to 30 ratio 2-4 clicks daily. Dispense 1 month supply 0RF. 1 each 0  . NONFORMULARY OR COMPOUNDED ITEM Progesterone SR 200 mg take 1 tablet daily. 30 each 0  . Probiotic Product (PROBIOTIC PO) Take by mouth.    . SYNTHROID 75 MCG tablet Take 1 tablet (75 mcg total) by mouth daily. 30 tablet 1  . UNABLE TO FIND Testosterone 0.25 mg every day (compounded)    . UNABLE TO FIND Quercetine 800 mg po 3 times per day    . VITAMIN D PO Take by mouth. Vit d 5000 2 x daily     No current facility-administered medications on file prior to visit.     Past Medical History:  Diagnosis Date  . Abnormal Pap smear of cervix 07-31-13   --pt. 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 Allergy   . Arthritis   . Chicken pox   . Heart murmur   . Osteopenia   . Radial head fracture, closed 08/15/2017  . Thyroid disease     Past Surgical History:  Procedure Laterality Date  . ABDOMINAL HYSTERECTOMY     fibroids  . COLONOSCOPY    . PILONIDAL CYST DRAINAGE  age 34  . ROBOTIC ASSISTED SUPRACERVICAL HYSTERECTOMY  02/12/07   Adenomyosis and fibroids; Ovaries remain; [place]  . TONSILLECTOMY AND ADENOIDECTOMY  child    Social History   Socioeconomic History  . Marital status: Married    Spouse name: Not on file  . Number of children: Not on file  . Years of education: Not on file  . Highest education level: Not on file  Occupational History  . Not on file  Tobacco Use  . Smoking status: Never Smoker  . Smokeless tobacco: Never Used  Vaping Use  . Vaping Use: Never used  Substance and Sexual Activity  . Alcohol use: Yes    Alcohol/week: 7.0 standard drinks    Types: 7 Glasses of wine per week  . Drug use: No  . Sexual activity: Yes    Partners: Male  Birth control/protection: Surgical    Comment: hysterectomy  Other Topics Concern  . Not on file  Social History Narrative  . Not on file   Social Determinants of Health   Financial Resource Strain: Not on file  Food Insecurity: Not on file  Transportation Needs: Not on file  Physical Activity: Not on file  Stress: Not on file  Social Connections: Not on file    Family History  Problem Relation Age of Onset  . Hypertension Mother   . Osteoporosis Mother   . Osteoarthritis Mother   . Hyperlipidemia Father   . Non-Hodgkin's lymphoma Father   . Hypertension Maternal Grandmother   . Stroke Maternal Grandmother   . Osteoporosis Other   . Colon cancer Neg Hx   . Colon polyps Neg Hx   . Esophageal cancer Neg Hx   . Stomach  cancer Neg Hx   . Rectal cancer Neg Hx     Review of Systems  Constitutional: Negative for chills, fatigue and fever.  Eyes: Negative for visual disturbance.  Respiratory: Negative for cough, shortness of breath and wheezing.   Cardiovascular: Positive for palpitations (occ, transient). Negative for chest pain and leg swelling.  Gastrointestinal: Negative for abdominal pain, blood in stool, constipation, diarrhea and nausea.  Genitourinary: Negative for dysuria and hematuria.  Musculoskeletal: Positive for arthralgias and back pain (chiropractor).  Skin: Negative for color change and rash.  Neurological: Negative for dizziness, light-headedness, numbness and headaches.  Psychiatric/Behavioral: Negative for dysphoric mood. The patient is not nervous/anxious.        Objective:   Vitals:   03/17/20 1056  BP: 116/72  Pulse: 80  Temp: 98 F (36.7 C)  SpO2: 98%   Filed Weights   03/17/20 1056  Weight: 118 lb (53.5 kg)   Body mass index is 22.3 kg/m.  BP Readings from Last 3 Encounters:  03/17/20 116/72  02/11/20 122/82  02/09/20 110/72    Wt Readings from Last 3 Encounters:  03/17/20 118 lb (53.5 kg)  02/11/20 119 lb (54 kg)  02/09/20 118 lb (53.5 kg)   Depression screen [facility] 2/9 03/17/2020 07/09/2018 09/12/2016  Decreased Interest 0 0 0  Down, Depressed, Hopeless 1 0 0  PHQ - 2 Score 1 0 0  Altered sleeping 1 - -  Tired, decreased energy 0 - -  Change in appetite 0 - -  Feeling bad or failure about yourself  1 - -  Trouble concentrating 0 - -  Moving slowly or fidgety/restless 0 - -  Suicidal thoughts 0 - -  PHQ-9 Score 3 - -  Difficult doing work/chores Not difficult at all - -   GAD 7 : Generalized Anxiety Score 03/17/2020  Nervous, Anxious, on Edge 1  Control/stop worrying 0  Worry too much - different things 1  Trouble relaxing 0  Restless 0  Easily annoyed or irritable 0  Afraid - awful might happen 1  Total GAD 7 Score 3  Anxiety Difficulty Not difficult  at all        Physical Exam Constitutional: She appears well-developed and well-nourished. No distress.  HENT:  Head: Normocephalic and atraumatic.  Right Ear: External ear normal. Normal ear canal and TM Left Ear: External ear normal.  Normal ear canal and TM Mouth/Throat: Oropharynx is clear and moist.  Eyes: Conjunctivae and EOM are normal.  Neck: Neck supple. No tracheal deviation present. No thyromegaly present.  No carotid bruit  Cardiovascular: Normal rate, regular rhythm and normal heart sounds.  No murmur heard.  No edema. Pulmonary/Chest: Effort normal and breath sounds normal. No respiratory distress. She has no wheezes. She has no rales.  Breast: deferred   Abdominal: Soft. She exhibits no distension. There is no tenderness.  Lymphadenopathy: She has no cervical adenopathy.  Skin: Skin is warm and dry. She is not diaphoretic.  Psychiatric: She has a normal mood and affect. Her behavior is normal.        Assessment & Plan:   Physical exam: Screening blood work    ordered Immunizations  deferred Flu vac, discussed shingrix Colonoscopy  Up to date  Mammogram  Up to date  Gyn  Up to date  Dexa   Up to date  Eye exams  scheduled Exercise  regular - spins, tennis, gym Weight  Normal  Substance abuse  none   Screened for depression using the PHQ 9 scale.  No evidence of depression.    Screening for anxiety using GAD 7 reveals mild anxiety, which she attributes word events.  Besides worrying about things going on in the world she denies any generalized anxiety.    See Problem List for Assessment and Plan of chronic medical problems.

## 2020-03-17 ENCOUNTER — Ambulatory Visit (INDEPENDENT_AMBULATORY_CARE_PROVIDER_SITE_OTHER): Payer: 59 | Admitting: Internal Medicine

## 2020-03-17 ENCOUNTER — Other Ambulatory Visit: Payer: Self-pay

## 2020-03-17 ENCOUNTER — Encounter: Payer: Self-pay | Admitting: Internal Medicine

## 2020-03-17 VITALS — BP 116/72 | HR 80 | Temp 98.0°F | Ht 61.0 in | Wt 118.0 lb

## 2020-03-17 DIAGNOSIS — Z0000 Encounter for general adult medical examination without abnormal findings: Secondary | ICD-10-CM | POA: Diagnosis not present

## 2020-03-17 DIAGNOSIS — E559 Vitamin D deficiency, unspecified: Secondary | ICD-10-CM | POA: Insufficient documentation

## 2020-03-17 DIAGNOSIS — E039 Hypothyroidism, unspecified: Secondary | ICD-10-CM | POA: Diagnosis not present

## 2020-03-17 DIAGNOSIS — M8580 Other specified disorders of bone density and structure, unspecified site: Secondary | ICD-10-CM | POA: Diagnosis not present

## 2020-03-17 DIAGNOSIS — N951 Menopausal and female climacteric states: Secondary | ICD-10-CM | POA: Diagnosis not present

## 2020-03-17 DIAGNOSIS — R002 Palpitations: Secondary | ICD-10-CM

## 2020-03-17 LAB — COMPREHENSIVE METABOLIC PANEL
ALT: 20 U/L (ref 0–35)
AST: 19 U/L (ref 0–37)
Albumin: 4.3 g/dL (ref 3.5–5.2)
Alkaline Phosphatase: 57 U/L (ref 39–117)
BUN: 18 mg/dL (ref 6–23)
CO2: 29 mEq/L (ref 19–32)
Calcium: 9.5 mg/dL (ref 8.4–10.5)
Chloride: 102 mEq/L (ref 96–112)
Creatinine, Ser: 0.64 mg/dL (ref 0.40–1.20)
GFR: 94.18 mL/min (ref >60.00)
Glucose, Bld: 87 mg/dL (ref 70–99)
Potassium: 3.8 mEq/L (ref 3.5–5.1)
Sodium: 136 mEq/L (ref 135–145)
Total Bilirubin: 1 mg/dL (ref 0.2–1.2)
Total Protein: 7.2 g/dL (ref 6.0–8.3)

## 2020-03-17 LAB — CBC WITH DIFFERENTIAL/PLATELET
Basophils Absolute: 0 10*3/uL (ref 0.0–0.1)
Basophils Relative: 1.2 % (ref 0.0–3.0)
Eosinophils Absolute: 0.1 10*3/uL (ref 0.0–0.7)
Eosinophils Relative: 1.7 % (ref 0.0–5.0)
HCT: 38.6 % (ref 36.0–46.0)
Hemoglobin: 13.2 g/dL (ref 12.0–15.0)
Lymphocytes Relative: 38.5 % (ref 12.0–46.0)
Lymphs Abs: 1.6 10*3/uL (ref 0.7–4.0)
MCHC: 34.3 g/dL (ref 30.0–36.0)
MCV: 93.1 fl (ref 78.0–100.0)
Monocytes Absolute: 0.7 10*3/uL (ref 0.1–1.0)
Monocytes Relative: 15.8 % — ABNORMAL HIGH (ref 3.0–12.0)
Neutro Abs: 1.8 10*3/uL (ref 1.4–7.7)
Neutrophils Relative %: 42.8 % — ABNORMAL LOW (ref 43.0–77.0)
Platelets: 204 10*3/uL (ref 150.0–400.0)
RBC: 4.15 Mil/uL (ref 3.87–5.11)
RDW: 13.3 % (ref 11.5–15.5)
WBC: 4.2 10*3/uL (ref 4.0–10.5)

## 2020-03-17 LAB — LIPID PANEL
Cholesterol: 215 mg/dL — ABNORMAL HIGH (ref 0–200)
HDL: 83.8 mg/dL (ref >39.00)
LDL Cholesterol: 109 mg/dL — ABNORMAL HIGH (ref 0–99)
NonHDL: 131.04
Total CHOL/HDL Ratio: 3
Triglycerides: 108 mg/dL (ref 0.0–149.0)
VLDL: 21.6 mg/dL (ref 0.0–40.0)

## 2020-03-17 NOTE — Assessment & Plan Note (Signed)
Chronic dexa up to date via Gyn Continue regular exercise Continue vitamin D, MVI

## 2020-03-17 NOTE — Addendum Note (Signed)
Addended by: Boris Lown B on: 03/17/2020 11:53 AM   Modules accepted: Orders

## 2020-03-17 NOTE — Assessment & Plan Note (Signed)
Chronic Taking vitamin D daily Check vitamin D level  

## 2020-03-17 NOTE — Assessment & Plan Note (Addendum)
Chronic Clinically euthyroid Will check tsh Managed by Dr Dalbert Batman

## 2020-03-17 NOTE — Assessment & Plan Note (Signed)
Chronic Has had intermittent palpitations for years  W/u in past neg Has had some variation in the palpitations but still transient and no concerning symptoms EKG today - NSR 64 bpm, possible PAE, normal EKG.  No change compared to EKG from 2014

## 2020-03-17 NOTE — Progress Notes (Signed)
[facility] [address] [place] Phone: [phone] Subjective:   I Angela [name] am serving as a Education administrator for Dr. Hulan Saas.  This visit occurred during the SARS-CoV-2 public health emergency.  Safety protocols were in place, including screening questions prior to the visit, additional usage of staff PPE, and extensive cleaning of exam room while observing appropriate contact time as indicated for disinfecting solutions.   I'm seeing this patient by the request  of:  Binnie Rail, MD  CC: Left wrist pain follow-up  KCL:[id number]   02/11/2020 Patient did respond significantly well previously.  This time though there was some more synovial fluid eventually ganglion fluid.  Patient has been doing a lot more activity with her granddaughter that likely contributed this time.  If it does seem to come back sooner we do need to consider potential surgical intervention.  Patient has significant amount of inflammation but no significant neovascularization and no true mass.  No cortical irregularity of the bone noted patient will follow up with me again in 4 weeks.  We did discuss potential referral to hand surgery today but patient declined with her traveling out of state as well as has been going to be having surgery so wanted to try aspiration 1 more time.  [place] 03/18/2020 Angela [name] is a 65 y.o. female coming in with complaint of left wrist pain.  Last injection and aspiration was done on February 16, 2020.  Patient states unfortunately it did come back.  Initially did seem to get smaller but now is back.  Giving her pain on a daily basis.  Left wrist x-ray showed that patient did have mild degenerative changes of the wrist.  Patient did have a soft tissue calcification proximal to the first carpometacarpal joint that could be consistent with basal ganglion cyst.  Patient states the pain is back and she is not sure if she should  get surgery or not.     Past Medical History:  Diagnosis Date  . Abnormal Pap smear of cervix 07-31-13   --pt. 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 Allergy   . Arthritis   . Chicken pox   . Heart murmur   . Osteopenia   . Radial head fracture, closed 08/15/2017  . Thyroid disease    Past Surgical History:  Procedure Laterality Date  . ABDOMINAL HYSTERECTOMY     fibroids  . COLONOSCOPY    . PILONIDAL CYST DRAINAGE  age 43  . ROBOTIC ASSISTED SUPRACERVICAL HYSTERECTOMY  02/12/07   Adenomyosis and fibroids; Ovaries remain; [place]  . TONSILLECTOMY AND ADENOIDECTOMY  child   Social History   Socioeconomic History  . Marital status: Married    Spouse name: Not on file  . Number of children: Not on file  . Years of education: Not on file  . Highest education level: Not on file  Occupational History  . Not on file  Tobacco Use  . Smoking status: Never Smoker  . Smokeless tobacco: Never Used  Vaping Use  . Vaping Use: Never used  Substance and Sexual Activity  . Alcohol use: Yes    Alcohol/week: 7.0 standard drinks    Types: 7 Glasses of wine per week  . Drug use: No  . Sexual activity: Yes    Partners: Male    Birth control/protection: Surgical    Comment: hysterectomy  Other Topics Concern  . Not on file  Social History Narrative  .  Not on file   Social Determinants of Health   Financial Resource Strain: Not on file  Food Insecurity: Not on file  Transportation Needs: Not on file  Physical Activity: Not on file  Stress: Not on file  Social Connections: Not on file   No Known Allergies Family History  Problem Relation Age of Onset  . Hypertension Mother   . Osteoporosis Mother   . Osteoarthritis Mother   . Hyperlipidemia Father   . Non-Hodgkin's lymphoma Father   . Hypertension Maternal Grandmother   . Stroke Maternal Grandmother   . Osteoporosis Other   . Colon cancer Neg Hx   . Colon polyps Neg Hx   .  Esophageal cancer Neg Hx   . Stomach cancer Neg Hx   . Rectal cancer Neg Hx     Current Outpatient Medications (Endocrine & Metabolic):  .  SYNTHROID 75 MCG tablet, Take 1 tablet (75 mcg total) by mouth daily.      Current Outpatient Medications (Other):  .  b complex vitamins tablet, Take 1 tablet by mouth daily. .  fish oil-omega-3 fatty acids 1000 MG capsule, Take 2 g by mouth daily. .  Multiple Vitamin (MULTIVITAMIN) tablet, Take 1 tablet by mouth daily. .  Multiple Vitamins-Minerals (ZINC PO), Take 1 tablet by mouth daily. .  NONFORMULARY OR COMPOUNDED ITEM, Bi-Est 5 mg 70 to 30 ratio 2-4 clicks daily. Dispense 1 month supply 0RF. Marland Kitchen  NONFORMULARY OR COMPOUNDED ITEM, Progesterone SR 200 mg take 1 tablet daily. .  Probiotic Product (PROBIOTIC PO), Take by mouth. Marland Kitchen  UNABLE TO FIND, Testosterone 0.25 mg every day (compounded) .  UNABLE TO FIND, Quercetine 800 mg po 3 times per day .  VITAMIN D PO, Take by mouth. Vit d 5000 2 x daily   Reviewed prior external information including notes and imaging from  primary care provider As well as notes that were available from care everywhere and other healthcare systems.  Past medical history, social, surgical and family history all reviewed in electronic medical record.  No pertanent information unless stated regarding to the chief complaint.   Review of Systems:  No headache, visual changes, nausea, vomiting, diarrhea, constipation, dizziness, abdominal pain, skin rash, fevers, chills, night sweats, weight loss, swollen lymph nodes, body aches, joint swelling, chest pain, shortness of breath, mood changes. POSITIVE muscle aches  Objective  Blood pressure 100/80, pulse 82, height 5\' 1"  (1.549 m), weight 118 lb (53.5 kg), last menstrual period 02/12/2007, SpO2 99 %.   General: No apparent distress alert and oriented x3 mood and affect normal, dressed appropriately.  HEENT: Pupils equal, extraocular movements intact  Respiratory:  Patient's speak in full sentences and does not appear short of breath  Cardiovascular: No lower extremity edema, non tender, no erythema  Gait normal with good balance and coordination.  MSK: Left wrist exam does show that patient does have reaccumulation again over the abductor pollicis longus that is consistent with the cyst previously and does have some enlargement.  Patient does have a mild positive Finkelstein's noted.  [place] joint he does have some mild tenderness to palpation.  Mild positive grind test.  No significant thenar eminence wasting noted Continues to have good range of motion of the wrist.   Impression and Recommendations:     The above documentation has been reviewed and is accurate and complete Lyndal Pulley, DO

## 2020-03-17 NOTE — Assessment & Plan Note (Signed)
Chronic On compounded HRT - managed by Dr Dalbert Batman

## 2020-03-18 ENCOUNTER — Encounter: Payer: Self-pay | Admitting: Family Medicine

## 2020-03-18 ENCOUNTER — Other Ambulatory Visit: Payer: Self-pay

## 2020-03-18 ENCOUNTER — Ambulatory Visit: Payer: Self-pay

## 2020-03-18 ENCOUNTER — Ambulatory Visit (INDEPENDENT_AMBULATORY_CARE_PROVIDER_SITE_OTHER): Payer: 59 | Admitting: Family Medicine

## 2020-03-18 VITALS — BP 100/80 | HR 82 | Ht 61.0 in | Wt 118.0 lb

## 2020-03-18 DIAGNOSIS — M25532 Pain in left wrist: Secondary | ICD-10-CM

## 2020-03-18 DIAGNOSIS — M67442 Ganglion, left hand: Secondary | ICD-10-CM | POA: Diagnosis not present

## 2020-03-18 LAB — VITAMIN D 25 HYDROXY (VIT D DEFICIENCY, FRACTURES): VITD: 58.46 ng/mL (ref 30.00–100.00)

## 2020-03-18 LAB — TSH: TSH: 0.15 u[IU]/mL — ABNORMAL LOW (ref 0.35–4.50)

## 2020-03-18 NOTE — Patient Instructions (Signed)
Good to see you MRI left wrist Gram[facility] referral I will write you when I have results Talk to [place] I am here for questions

## 2020-03-18 NOTE — Assessment & Plan Note (Signed)
Patient continues to have this.  Last time we did do continuous more synovial fluid eventually ganglion material.  Discussed with patient that with this coming back with 6 weeks I do feel that surgical intervention would be necessary.  Patient did have significant amount of neovascularization in the area and patient would like to wait until winter to potentially have this done.  We will order MRI of the wrist to further evaluate the cyst area as well as the arthritic changes of some of the joints of the wrist.  At the same time will refer patient to hand surgery to discuss potential removal which I think will be more beneficial for her at this time.  Patient knows we are here for any other questions.

## 2020-04-27 ENCOUNTER — Other Ambulatory Visit: Payer: 59

## 2020-05-04 ENCOUNTER — Encounter: Payer: Self-pay | Admitting: Internal Medicine

## 2020-05-18 ENCOUNTER — Telehealth: Payer: Self-pay | Admitting: Internal Medicine

## 2020-05-18 DIAGNOSIS — Z01818 Encounter for other preprocedural examination: Secondary | ICD-10-CM

## 2020-05-18 NOTE — Telephone Encounter (Signed)
Patient called and said that she is getting cosmetic surgery soon and has a surgical clearance form that needs to be filled out. She was last seen 03-17-20. She had her EKG and blood work done but they are asking for PT INR and a PTT activated. Please advise.

## 2020-05-18 NOTE — Telephone Encounter (Signed)
Ordered

## 2020-05-19 NOTE — Telephone Encounter (Signed)
Appointment scheduled and patient will drop off form when she comes in for lab appt.

## 2020-06-03 ENCOUNTER — Other Ambulatory Visit (INDEPENDENT_AMBULATORY_CARE_PROVIDER_SITE_OTHER): Payer: 59

## 2020-06-03 ENCOUNTER — Other Ambulatory Visit: Payer: Self-pay

## 2020-06-03 DIAGNOSIS — Z01818 Encounter for other preprocedural examination: Secondary | ICD-10-CM | POA: Diagnosis not present

## 2020-06-03 LAB — APTT: aPTT: 27.5 s (ref 23.4–32.7)

## 2020-06-03 LAB — PROTIME-INR
INR: 0.9 ratio (ref 0.8–1.0)
Prothrombin Time: 10.5 s (ref 9.6–13.1)

## 2020-06-09 NOTE — Telephone Encounter (Signed)
Patient called and said that the office where she is doing her cosmetic surgery said they have not received her recent blood work and she was wondering if they could be sent. Please advise   [facility]  Phone: [phone]

## 2020-06-10 NOTE — Telephone Encounter (Signed)
Paperwork was faxed back on 5/20 @ 1:22 pm with faxed conformation received.  Re-faxed paperwork again today.

## 2020-06-10 NOTE — Telephone Encounter (Signed)
Fax conformation received today with faxed clearance and copy of labs.  Total of 9 pages faxed and received.

## 2020-10-04 ENCOUNTER — Other Ambulatory Visit: Payer: 59

## 2020-11-22 ENCOUNTER — Ambulatory Visit (INDEPENDENT_AMBULATORY_CARE_PROVIDER_SITE_OTHER): Payer: 59

## 2020-11-22 ENCOUNTER — Other Ambulatory Visit: Payer: Self-pay

## 2020-11-22 DIAGNOSIS — Z23 Encounter for immunization: Secondary | ICD-10-CM

## 2020-11-22 NOTE — Progress Notes (Signed)
Flu given today

## 2021-02-22 ENCOUNTER — Other Ambulatory Visit: Payer: Self-pay | Admitting: Internal Medicine

## 2021-02-22 DIAGNOSIS — Z1231 Encounter for screening mammogram for malignant neoplasm of breast: Secondary | ICD-10-CM

## 2021-03-17 ENCOUNTER — Ambulatory Visit
Admission: RE | Admit: 2021-03-17 | Discharge: 2021-03-17 | Disposition: A | Discharge status: Home / Self Care | Payer: 59 | Source: Ambulatory Visit | Attending: Internal Medicine | Admitting: Internal Medicine

## 2021-03-17 DIAGNOSIS — Z1231 Encounter for screening mammogram for malignant neoplasm of breast: Secondary | ICD-10-CM

## 2021-03-21 NOTE — Progress Notes (Signed)
? ? Angela [name] D.Merril Abbe ?[facility] ?[place] ?Phone: [phone] ?  ?Assessment and Plan:   ?  ?1. Left knee pain, unspecified chronicity ?2. Pes anserinus bursitis of left knee ?-Chronic with exacerbation, initial sports medicine visit ?- Intermittent medial knee pain that has waxed and waned over the past 8 months, becoming more consistent over the last 1 week, most consistent with pes anserine bursitis of left knee based on HPI, physical exam, likely compensation with pes planus and patient overpronation ?- Start meloxicam 15 mg daily x2 weeks.  If still having pain after 2 weeks, complete 3rd-week of meloxicam. May use remaining meloxicam as needed once daily for pain control.  Do not to use additional NSAIDs while taking meloxicam.  May use Tylenol [phone] mg 2 to 3 times a day for breakthrough pain. ?- Start HEP for adductor's and pes anserine bursitis ?- Recommend getting arch support inserts.  [employer] store given to patient as a potential option ?-No red flag symptoms, no significant MOI, so no imaging performed today ?  ?Pertinent previous records reviewed include none ?  ?Follow Up: Patient has follow-up visit scheduled with Dr. Tamala Julian in 4 weeks.  She can keep this appointment and discuss improvement at that time.  If no improvement or worsening of symptoms, could consider knee x-ray versus pes anserine CSI versus PT versus ultrasound ?  ?Subjective:   ?I, Angela [name], am serving as a Education administrator for Doctor [employer] ? ?Chief Complaint: left knee pain  ? ?HPI:  ?03/22/2021 ?Patient is a 66 year old female complaining of left knee pain. Patient states that it hurts off and on for months on the medial aspect of the knee , she was playing tennis yesterday 03/21/2021 and her knee pain made her stop playing, she doesn't remember and MOI it will come one day and then be gone the next day for a couple of months , she thinks it might be her arches,  starting bunions, does not get any numbness or tingling, no clicking or popping, has tried ice yesterday that was the first time,has not taken any meds , has been using some gel topical relief cant remember the name.  ? ? ?Relevant Historical Information: Hypothyroidism ? ?Additional pertinent review of systems negative. ? ? ?Current Outpatient Medications:  ?  b complex vitamins tablet, Take 1 tablet by mouth daily., Disp: , Rfl:  ?  fish oil-omega-3 fatty acids 1000 MG capsule, Take 2 g by mouth daily., Disp: , Rfl:  ?  meloxicam (MOBIC) 15 MG tablet, Take 1 tablet (15 mg total) by mouth daily., Disp: 30 tablet, Rfl: 0 ?  Multiple Vitamin (MULTIVITAMIN) tablet, Take 1 tablet by mouth daily., Disp: , Rfl:  ?  Multiple Vitamins-Minerals (ZINC PO), Take 1 tablet by mouth daily., Disp: , Rfl:  ?  NONFORMULARY OR COMPOUNDED ITEM, Bi-Est 5 mg 70 to 30 ratio 2-4 clicks daily. Dispense 1 month supply 0RF., Disp: 1 each, Rfl: 0 ?  NONFORMULARY OR COMPOUNDED ITEM, Progesterone SR 200 mg take 1 tablet daily., Disp: 30 each, Rfl: 0 ?  Probiotic Product (PROBIOTIC PO), Take by mouth., Disp: , Rfl:  ?  SYNTHROID 75 MCG tablet, Take 1 tablet (75 mcg total) by mouth daily., Disp: 30 tablet, Rfl: 1 ?  UNABLE TO FIND, Testosterone 0.25 mg every day (compounded), Disp: , Rfl:  ?  UNABLE TO FIND, Quercetine 800 mg po 3 times per day, Disp: , Rfl:  ?  VITAMIN D PO, Take by mouth. Vit d 5000 2 x daily, Disp: , Rfl:   ? ?Objective:   ?  ?Vitals:  ? 03/22/21 0630  ?BP: 118/80  ?Pulse: 74  ?SpO2: 99%  ?Weight: 117 lb (53.1 kg)  ?Height: '5\' 1"'$  (1.549 m)  ?  ?  ?Body mass index is 22.11 kg/m?.  ?  ?Physical Exam:   ? ?General:  awake, alert oriented, no acute distress nontoxic ?Skin: no suspicious lesions or rashes ?Neuro:sensation intact, no deficits, strength 5/5 with no deficits, no atrophy, normal muscle tone ?Psych: No signs of anxiety, depression or other mood disorder ? ?Knee: ?No swelling ?No deformity ?Neg fluid wave, joint  milking ?ROM Flex 110 , Ext 0  ?TTP pes anserine bursa ?NTTP over the quad tendon, medial fem condyle, lat fem condyle, patella, plica, patella tendon, tibial tuberostiy, fibular head, posterior fossa,  gerdy's tubercle, medial jt line, lateral jt line ?Neg anterior and posterior drawer ?Neg lachman ?Neg sag sign ?Negative varus stress ?Negative valgus stress ?Negative McMurray ?Negative Thessaly ? ?Gait normal  ? ? ?Electronically signed by:  ?Angela [name] D.Merril Abbe ?[facility] ?8:50 AM 03/22/21 ?

## 2021-03-22 ENCOUNTER — Ambulatory Visit (INDEPENDENT_AMBULATORY_CARE_PROVIDER_SITE_OTHER): Payer: 59 | Admitting: Sports Medicine

## 2021-03-22 ENCOUNTER — Other Ambulatory Visit: Payer: Self-pay

## 2021-03-22 VITALS — BP 118/80 | HR 74 | Ht 61.0 in | Wt 117.0 lb

## 2021-03-22 DIAGNOSIS — M7052 Other bursitis of knee, left knee: Secondary | ICD-10-CM

## 2021-03-22 DIAGNOSIS — M25562 Pain in left knee: Secondary | ICD-10-CM

## 2021-03-22 MED ORDER — MELOXICAM 15 MG PO TABS: 15.0000 mg | ORAL_TABLET | Freq: Every day | ORAL | 0 refills | Status: DC

## 2021-03-22 NOTE — Patient Instructions (Addendum)
Good to see you  ?- Start meloxicam 15 mg daily x2 weeks.  If still having pain after 2 weeks, complete 3rd-week of meloxicam. May use remaining meloxicam as needed once daily for pain control.  Do not to use additional NSAIDs while taking meloxicam.  May use Tylenol [phone] mg 2 to 3 times a day for breakthrough pain. ?Pes anserine HEP  ?Rest from tennis 1 week then re introduce after week 2 ?Can use Voltaren gel  ?Go to [employer] for arch support  ?Follow up with doctor smith one month  ? ?

## 2021-04-18 ENCOUNTER — Other Ambulatory Visit: Payer: Self-pay | Admitting: Sports Medicine

## 2021-04-19 ENCOUNTER — Ambulatory Visit: Payer: 59 | Admitting: Family Medicine

## 2021-05-04 NOTE — Progress Notes (Signed)
66 y.o. G61P0003 Married Caucasian female here for annual exam.   ? ?Feels her bladder vaginally, but no leakage.  ?Able to void.  ?Does Kegel's.  ? ?Dealing with a hemorrhoid.  ?No pain. ?No bleeding.  ? ?She is on HRT through [facility].  ?Weaning down.  ? ?Mom passed 09/2020 at 33!! ? ?PCP:  Billey Gosling, MD  ? ?Patient's last menstrual period was 02/12/2007 (exact date).     ?  ?    ?Sexually active: Yes.    ?The current method of family planning is status post hysterectomy.    ?Exercising: Yes.     Tennis, spin, weights ?Smoker:  no ? ?Health Maintenance: ?Pap:   10/04/17 Neg:Neg HR HPV, 09/06/2016 Neg:Neg HR HPV, 08-30-15 Neg:Neg HR HPV ?History of abnormal Pap:  Yes,  08-21-14 ASCUS:Pos HR HPV with colposcopy revealing atypia on exocervical bx and Neg ECC. ?MMG:  03-17-21 Neg/Birads1 ?Colonoscopy:  03-13-18 normal;10 years ?BMD:  10-03-18  Result : Osteopenia left hip and spine ?TDaP:  04-22-12 ?Gardasil:   n/a ?HIV: no ?Hep C: no ?Screening Labs: PCP ? ? reports that she has never smoked. She has never used smokeless tobacco. She reports current alcohol use of about 7.0 standard drinks per week. She reports that she does not use drugs. ? ?Past Medical History:  ?Diagnosis Date  ? Abnormal Pap smear of cervix 07-31-13  ? --pt. 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  ? Allergy   ? Arthritis   ? Chicken pox   ? Heart murmur   ? Osteopenia   ? Radial head fracture, closed 08/15/2017  ? Thyroid disease   ? ? ?Past Surgical History:  ?Procedure Laterality Date  ? ABDOMINAL HYSTERECTOMY    ? fibroids  ? BREAST CYST ASPIRATION    ? COLONOSCOPY    ? PILONIDAL CYST DRAINAGE  age 65  ? ROBOTIC ASSISTED SUPRACERVICAL HYSTERECTOMY  02/12/2007  ? Adenomyosis and fibroids; Ovaries remain; [place]  ? TONSILLECTOMY AND ADENOIDECTOMY  child  ? ? ?Current Outpatient Medications  ?Medication Sig Dispense Refill  ? b complex vitamins tablet Take 1 tablet by mouth daily.    ? fish oil-omega-3 fatty  acids 1000 MG capsule Take 2 g by mouth daily.    ? liothyronine (CYTOMEL) 5 MCG tablet Take 10 mcg by mouth daily.    ? meloxicam (MOBIC) 15 MG tablet Take 1 tablet (15 mg total) by mouth daily. 30 tablet 0  ? Multiple Vitamin (MULTIVITAMIN) tablet Take 1 tablet by mouth daily.    ? Multiple Vitamins-Minerals (ZINC PO) Take 1 tablet by mouth daily.    ? NONFORMULARY OR COMPOUNDED ITEM Bi-Est 5 mg 70 to 30 ratio 2-4 clicks daily. ?Dispense 1 month supply 0RF. 1 each 0  ? NONFORMULARY OR COMPOUNDED ITEM Progesterone SR 200 mg take 1 tablet daily. 30 each 0  ? Probiotic Product (PROBIOTIC PO) Take by mouth.    ? SYNTHROID 75 MCG tablet Take 1 tablet (75 mcg total) by mouth daily. 30 tablet 1  ? UNABLE TO FIND Testosterone 0.25 mg every day (compounded)    ? UNABLE TO FIND Quercetine 800 mg po 3 times per day    ? VITAMIN D PO Take by mouth. Vit d 5000 2 x daily    ? ?No current facility-administered medications for this visit.  ? ? ?Family History  ?Problem Relation Age of Onset  ? Hypertension Mother   ? Osteoporosis Mother   ? Osteoarthritis Mother   ?  Hyperlipidemia Father   ? Non-Hodgkin's lymphoma Father   ? Hypertension Maternal Grandmother   ? Stroke Maternal Grandmother   ? Osteoporosis Other   ? Colon cancer Neg Hx   ? Colon polyps Neg Hx   ? Esophageal cancer Neg Hx   ? Stomach cancer Neg Hx   ? Rectal cancer Neg Hx   ? ? ?Review of Systems  ?All other systems reviewed and are negative. ? ?Exam:   ?BP 130/68   Pulse 66   Ht '5\' 1"'$  (1.549 m)   Wt 118 lb (53.5 kg)   LMP 02/12/2007 (Exact Date)   SpO2 99%   BMI 22.30 kg/m?     ?General appearance: alert, cooperative and appears stated age ?Head: normocephalic, without obvious abnormality, atraumatic ?Neck: no adenopathy, supple, symmetrical, trachea midline and thyroid normal to inspection and palpation ?Lungs: clear to auscultation bilaterally ?Breasts: normal appearance, no masses or tenderness, No nipple retraction or dimpling, No nipple discharge or  bleeding, No axillary adenopathy ?Heart: regular rate and rhythm ?Abdomen: soft, non-tender; no masses, no organomegaly ?Extremities: extremities normal, atraumatic, no cyanosis or edema ?Skin: skin color, texture, turgor normal. No rashes or lesions ?Lymph nodes: cervical, supraclavicular, and axillary nodes normal. ?Neurologic: grossly normal ? ?Pelvic: External genitalia:  no lesions ?             No abnormal inguinal nodes palpated. ?             Urethra:  normal appearing urethra with no masses, tenderness or lesions ?             Bartholins and Skenes: normal    ?             Vagina: normal appearing vagina with normal color and discharge, no lesions ?             Cervix: no lesions ?             Pap taken: no ?Bimanual Exam:  Uterus:  normal size, contour, position, consistency, mobility, non-tender ?             Adnexa: no mass, fullness, tenderness ?             Rectal exam: Yes.  .  Confirms. ?             Anus:  normal sphincter tone,hemorrhoid noted.  No thrombus or inflammation.  ? ?Chaperone was present for exam:  Estill Bamberg, CMA ? ?Assessment:   ?Well woman visit with gynecologic exam. ?On bioidentical HRT and testosterone therapy through outside provider.  ?Hx supracervical hysterectomy. Ovaries remain.  ?Hx prior positive HR HPV and LGSIL. ?Osteopenia. ? ?Plan: ?Mammogram screening discussed. ?Self breast awareness reviewed. ?Pap and HR HPV 2024 by ASCCP guidelines.  ?Guidelines for Calcium, Vitamin D, regular exercise program including cardiovascular and weight bearing exercise. ?We discussed risks and benefits of HRT.  ?BMD ordered.   ?Condolences for the loss of her mother.  ?Follow up annually and prn.  ? ?After visit summary provided.  ? ? ? ?

## 2021-05-10 ENCOUNTER — Ambulatory Visit (INDEPENDENT_AMBULATORY_CARE_PROVIDER_SITE_OTHER): Payer: 59 | Admitting: Obstetrics and Gynecology

## 2021-05-10 ENCOUNTER — Encounter: Payer: Self-pay | Admitting: Obstetrics and Gynecology

## 2021-05-10 VITALS — BP 130/68 | HR 66 | Ht 61.0 in | Wt 118.0 lb

## 2021-05-10 DIAGNOSIS — Z01419 Encounter for gynecological examination (general) (routine) without abnormal findings: Secondary | ICD-10-CM | POA: Diagnosis not present

## 2021-05-10 DIAGNOSIS — M8580 Other specified disorders of bone density and structure, unspecified site: Secondary | ICD-10-CM | POA: Diagnosis not present

## 2021-05-10 NOTE — Patient Instructions (Signed)

## 2021-10-10 ENCOUNTER — Other Ambulatory Visit: Payer: Self-pay | Admitting: *Deleted

## 2021-10-10 DIAGNOSIS — M79605 Pain in left leg: Secondary | ICD-10-CM

## 2021-10-17 ENCOUNTER — Ambulatory Visit (HOSPITAL_COMMUNITY)
Admission: RE | Admit: 2021-10-17 | Discharge: 2021-10-17 | Disposition: A | Discharge status: Home / Self Care | Payer: 59 | Source: Ambulatory Visit | Attending: Surgery | Admitting: Surgery

## 2021-10-17 ENCOUNTER — Ambulatory Visit (INDEPENDENT_AMBULATORY_CARE_PROVIDER_SITE_OTHER): Payer: 59 | Admitting: Physician Assistant

## 2021-10-17 VITALS — BP 123/82 | HR 71 | Temp 97.9°F | Resp 14 | Ht 61.0 in | Wt 116.0 lb

## 2021-10-17 DIAGNOSIS — M79604 Pain in right leg: Secondary | ICD-10-CM | POA: Diagnosis present

## 2021-10-17 DIAGNOSIS — I83812 Varicose veins of left lower extremities with pain: Secondary | ICD-10-CM | POA: Diagnosis not present

## 2021-10-17 DIAGNOSIS — M79605 Pain in left leg: Secondary | ICD-10-CM | POA: Insufficient documentation

## 2021-10-17 NOTE — Progress Notes (Signed)
Requested by:  Binnie Rail, MD [place],  [place] [address]  Reason for consultation: painful varicose veins    History of Present Illness   Angela [name] is a 66 y.o. ([birth year]) female who presents for evaluation of painful varicose veins.  She states that she has had varicose veins in her left leg for decades, and they have gotten bigger over time.  After being on her feet for long period of time, the veins will engorge and cause her aching pain.  They will also occasionally throb.  She denies any history of DVT, previous vein procedures, bleeding events, or ulceration.  She denies any leg swelling.  She also denies rest pain, claudication, nonhealing wounds of the lower extremities. Past Medical History:  Diagnosis Date   Abnormal Pap smear of cervix 07-31-13   --pt. 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   Allergy    Arthritis    Chicken pox    Heart murmur    Osteopenia    Radial head fracture, closed 08/15/2017   Thyroid disease     Past Surgical History:  Procedure Laterality Date   ABDOMINAL HYSTERECTOMY     fibroids   BREAST CYST ASPIRATION     COLONOSCOPY     PILONIDAL CYST DRAINAGE  age 52   ROBOTIC ASSISTED SUPRACERVICAL HYSTERECTOMY  02/12/2007   Adenomyosis and fibroids; Ovaries remain; [place]  child    Social History   Socioeconomic History   Marital status: Married    Spouse name: Not on file   Number of children: Not on file   Years of education: Not on file   Highest education level: Not on file  Occupational History   Not on file  Tobacco Use   Smoking status: Never   Smokeless tobacco: Never  Vaping Use   Vaping Use: Never used  Substance and Sexual Activity   Alcohol use: Yes    Alcohol/week: 7.0 standard drinks of alcohol    Types: 7 Glasses of wine per week   Drug use: No   Sexual activity: Yes    Partners: Male    Birth  control/protection: Surgical    Comment: hysterectomy  Other Topics Concern   Not on file  Social History Narrative   Not on file   Social Determinants of Health   Financial Resource Strain: Not on file  Food Insecurity: Not on file  Transportation Needs: Not on file  Physical Activity: Not on file  Stress: Not on file  Social Connections: Not on file  Intimate Partner Violence: Not on file    Family History  Problem Relation Age of Onset   Hypertension Mother    Osteoporosis Mother    Osteoarthritis Mother    Hyperlipidemia Father    Non-Hodgkin's lymphoma Father    Hypertension Maternal Grandmother    Stroke Maternal Grandmother    Osteoporosis Other    Colon cancer Neg Hx    Colon polyps Neg Hx    Esophageal cancer Neg Hx    Stomach cancer Neg Hx    Rectal cancer Neg Hx     Current Outpatient Medications  Medication Sig Dispense Refill   b complex vitamins tablet Take 1 tablet by mouth daily.     fish oil-omega-3 fatty acids 1000 MG capsule Take 2 g by mouth daily.     liothyronine (CYTOMEL) 5 MCG tablet Take 10 mcg by mouth daily.  Multiple Vitamin (MULTIVITAMIN) tablet Take 1 tablet by mouth daily.     Multiple Vitamins-Minerals (ZINC PO) Take 1 tablet by mouth daily.     NONFORMULARY OR COMPOUNDED ITEM Bi-Est 5 mg 70 to 30 ratio 2-4 clicks daily. Dispense 1 month supply 0RF. 1 each 0   NONFORMULARY OR COMPOUNDED ITEM Progesterone SR 200 mg take 1 tablet daily. 30 each 0   Probiotic Product (PROBIOTIC PO) Take by mouth.     SYNTHROID 75 MCG tablet Take 1 tablet (75 mcg total) by mouth daily. 30 tablet 1   UNABLE TO FIND Testosterone 0.25 mg every day (compounded)     UNABLE TO FIND Quercetine 800 mg po 3 times per day     VITAMIN D PO Take by mouth. Vit d 5000 2 x daily     meloxicam (MOBIC) 15 MG tablet Take 1 tablet (15 mg total) by mouth daily. (Patient not taking: Reported on 10/17/2021) 30 tablet 0   No current facility-administered medications for this  visit.    No Known Allergies  REVIEW OF SYSTEMS (negative unless checked):   Cardiac:  '[]'$  Chest pain or chest pressure? '[]'$  Shortness of breath upon activity? '[]'$  Shortness of breath when lying flat? '[]'$  Irregular heart rhythm?  Vascular:  '[x]'$  Pain in calf, thigh, or hip brought on by walking? '[]'$  Pain in feet at night that wakes you up from your sleep? '[]'$  Blood clot in your veins? '[]'$  Leg swelling?  Pulmonary:  '[]'$  Oxygen at home? '[]'$  Productive cough? '[]'$  Wheezing?  Neurologic:  '[]'$  Sudden weakness in arms or legs? '[]'$  Sudden numbness in arms or legs? '[]'$  Sudden onset of difficult speaking or slurred speech? '[]'$  Temporary loss of vision in one eye? '[]'$  Problems with dizziness?  Gastrointestinal:  '[]'$  Blood in stool? '[]'$  Vomited blood?  Genitourinary:  '[]'$  Burning when urinating? '[]'$  Blood in urine?  Psychiatric:  '[]'$  Major depression  Hematologic:  '[]'$  Bleeding problems? '[]'$  Problems with blood clotting?  Dermatologic:  '[]'$  Rashes or ulcers?  Constitutional:  '[]'$  Fever or chills?  Ear/Nose/Throat:  '[]'$  Change in hearing? '[]'$  Nose bleeds? '[]'$  Sore throat?  Musculoskeletal:  '[]'$  Back pain? '[]'$  Joint pain? '[]'$  Muscle pain?   Physical Examination     Vitals:   10/17/21 1247  BP: 123/82  Pulse: 71  Resp: 14  Temp: 97.9 F (36.6 C)  TempSrc: Temporal  SpO2: 98%  Weight: 116 lb (52.6 kg)  Height: '5\' 1"'$  (1.549 m)   Body mass index is 21.92 kg/m.  General:  WDWN in NAD; vital signs documented above Gait: Not observed HENT: WNL, normocephalic Pulmonary: normal non-labored breathing , without Rales, rhonchi,  wheezing Cardiac: regular HR, without murmurs without carotid bruit Abdomen: soft, NT, no masses Skin: without rashes Vascular Exam/Pulses: Palpable DP pulses bilaterally Extremities: with varicose veins, without reticular veins, without edema, without stasis pigmentation, without lipodermatosclerosis, without ulcers Musculoskeletal: no muscle wasting or  atrophy  Neurologic: A&O X 3;  No focal weakness or paresthesias are detected Psychiatric:  The pt has Normal affect.  Non-invasive Vascular Imaging   LLE Venous Insufficiency Duplex (10/17/2021)  +--------------+---------+------+-----------+------------+--------+  LEFT          Reflux NoRefluxReflux TimeDiameter cmsComments                          Yes                                   +--------------+---------+------+-----------+------------+--------+  CFV           no                                              +--------------+---------+------+-----------+------------+--------+  FV mid        no                                              +--------------+---------+------+-----------+------------+--------+  Popliteal     no                                              +--------------+---------+------+-----------+------------+--------+  GSV at SFJ              yes    >500 ms      0.47              +--------------+---------+------+-----------+------------+--------+  GSV prox thigh          yes    >500 ms      0.39              +--------------+---------+------+-----------+------------+--------+  GSV mid thigh           yes    >500 ms      0.36              +--------------+---------+------+-----------+------------+--------+  GSV dist thigh          yes    >500 ms      0.36              +--------------+---------+------+-----------+------------+--------+  GSV at knee             yes    >500 ms      0.38              +--------------+---------+------+-----------+------------+--------+  GSV prox calf           yes    >500 ms      0.37              +--------------+---------+------+-----------+------------+--------+  SSV Pop Fossa no                            0.24              +--------------+---------+------+-----------+------------+--------+    Medical Decision Making   Angela [name] is a  66 y.o. female who presents with: painful left lower extremity varicose veins  Based on the patient's left leg reflux study, she does have venous reflux throughout the entirety of her greater saphenous vein, beginning at the saphenofemoral junction and ending at the proximal calf.  The vein is just about 63m in its entirety of the thigh.  I believe she would be a good candidate for venous ablation. She has painful varicose veins at the bend of her left knee, left calf, and left shin.  These will engorged and cause her pain daily.  She is open to stab phlebectomy of these veins. I discussed with the patient the use of her 20-30 mm thigh high compression stockings and need for 3 month trial of  such.  The patient will follow up in 3 months with Dr.Dickson or Dr.Brabham.   Gerri Lins, PA-C [facility]: [phone]  10/17/2021, 2:22 PM  Clinic MD: Trula Slade

## 2021-11-01 ENCOUNTER — Other Ambulatory Visit: Payer: Self-pay | Admitting: Sports Medicine

## 2021-11-23 ENCOUNTER — Ambulatory Visit
Admission: RE | Admit: 2021-11-23 | Discharge: 2021-11-23 | Disposition: A | Discharge status: Home / Self Care | Payer: 59 | Source: Ambulatory Visit | Attending: Obstetrics and Gynecology | Admitting: Obstetrics and Gynecology

## 2021-11-23 DIAGNOSIS — M8580 Other specified disorders of bone density and structure, unspecified site: Secondary | ICD-10-CM

## 2022-01-25 ENCOUNTER — Ambulatory Visit (INDEPENDENT_AMBULATORY_CARE_PROVIDER_SITE_OTHER): Payer: Medicare Other | Admitting: Vascular Surgery

## 2022-01-25 ENCOUNTER — Encounter: Payer: Self-pay | Admitting: Vascular Surgery

## 2022-01-25 VITALS — BP 132/76 | HR 82 | Temp 98.2°F | Resp 18 | Ht 61.0 in | Wt 115.1 lb

## 2022-01-25 DIAGNOSIS — I83812 Varicose veins of left lower extremities with pain: Secondary | ICD-10-CM

## 2022-01-25 DIAGNOSIS — I8393 Asymptomatic varicose veins of bilateral lower extremities: Secondary | ICD-10-CM

## 2022-01-25 NOTE — Progress Notes (Signed)
REASON FOR VISIT:   Follow-up of painful varicose veins of the left lower extremity  MEDICAL ISSUES:   PAINFUL VARICOSE VEINS LEFT LOWER EXTREMITY: This patient has CEAP C2 venous disease.  Currently her symptoms are fairly mild and the veins are not especially large.  We have discussed importance of daily leg elevation and the proper positioning for this.  I have encouraged her to to continue to wear her compression stockings although for now a knee-high stocking may be more practical.  We discussed importance of exercise specifically walking and water aerobics.  I encouraged her to avoid prolonged sitting and standing.  Fortunately she has a nice healthy weight.  If her symptoms progress I think she could be considered for laser ablation of the left great saphenous vein.  She will call if her symptoms progress.   HPI:   Angela [name] is a pleasant 67 y.o. female who was seen by Luisa Dago, PA on 10-23 with painful varicose veins.  Her main concern was the left leg.  She had no previous history of DVT and no previous venous procedures.  She had palpable dorsalis pedis pulses.  Her venous duplex scan at that time showed significant superficial venous reflux.  She was encouraged to elevate her legs, exercise, and she was fitted for thigh-high compression stockings with a gradient of 20 to 30 mmHg.  She comes in for 34-monthfollow-up visit.  Patient does note some occasional throbbing in the left leg when she is standing for prolonged period of time.  She denies any significant swelling.  She has no previous history of DVT and has had no previous venous procedures.   Past Medical History:  Diagnosis Date   Abnormal Pap smear of cervix 07-31-13   --pt. 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   Allergy    Arthritis    Chicken pox    Heart murmur    Osteopenia    Radial head fracture, closed 08/15/2017   Thyroid disease     Family History   Problem Relation Age of Onset   Hypertension Mother    Osteoporosis Mother    Osteoarthritis Mother    Hyperlipidemia Father    Non-Hodgkin's lymphoma Father    Hypertension Maternal Grandmother    Stroke Maternal Grandmother    Osteoporosis Other    Colon cancer Neg Hx    Colon polyps Neg Hx    Esophageal cancer Neg Hx    Stomach cancer Neg Hx    Rectal cancer Neg Hx     SOCIAL HISTORY: Social History   Tobacco Use   Smoking status: Never   Smokeless tobacco: Never  Substance Use Topics   Alcohol use: Yes    Alcohol/week: 7.0 standard drinks of alcohol    Types: 7 Glasses of wine per week    No Known Allergies  Current Outpatient Medications  Medication Sig Dispense Refill   b complex vitamins tablet Take 1 tablet by mouth daily.     fish oil-omega-3 fatty acids 1000 MG capsule Take 2 g by mouth daily.     liothyronine (CYTOMEL) 5 MCG tablet Take 10 mcg by mouth daily.     meloxicam (MOBIC) 15 MG tablet Take 1 tablet (15 mg total) by mouth daily as needed for pain. 30 tablet 0   Multiple Vitamin (MULTIVITAMIN) tablet Take 1 tablet by mouth daily.     Multiple Vitamins-Minerals (ZINC PO) Take 1 tablet by mouth daily.  NONFORMULARY OR COMPOUNDED ITEM Bi-Est 5 mg 70 to 30 ratio 2-4 clicks daily. Dispense 1 month supply 0RF. 1 each 0   NONFORMULARY OR COMPOUNDED ITEM Progesterone SR 200 mg take 1 tablet daily. 30 each 0   Probiotic Product (PROBIOTIC PO) Take by mouth.     SYNTHROID 75 MCG tablet Take 1 tablet (75 mcg total) by mouth daily. 30 tablet 1   UNABLE TO FIND Testosterone 0.25 mg every day (compounded)     UNABLE TO FIND Quercetine 800 mg po 3 times per day     VITAMIN D PO Take by mouth. Vit d 5000 2 x daily     No current facility-administered medications for this visit.    REVIEW OF SYSTEMS:  '[X]'$  denotes positive finding, '[ ]'$  denotes negative finding Cardiac  Comments:  Chest pain or chest pressure:    Shortness of breath upon exertion:    Short  of breath when lying flat:    Irregular heart rhythm:        Vascular    Pain in calf, thigh, or hip brought on by ambulation:    Pain in feet at night that wakes you up from your sleep:     Blood clot in your veins:    Leg swelling:         Pulmonary    Oxygen at home:    Productive cough:     Wheezing:         Neurologic    Sudden weakness in arms or legs:     Sudden numbness in arms or legs:     Sudden onset of difficulty speaking or slurred speech:    Temporary loss of vision in one eye:     Problems with dizziness:         Gastrointestinal    Blood in stool:     Vomited blood:         Genitourinary    Burning when urinating:     Blood in urine:        Psychiatric    Major depression:         Hematologic    Bleeding problems:    Problems with blood clotting too easily:        Skin    Rashes or ulcers:        Constitutional    Fever or chills:     PHYSICAL EXAM:   Vitals:   01/25/22 1344  BP: 132/76  Pulse: 82  Resp: 18  Temp: 98.2 F (36.8 C)  TempSrc: Temporal  SpO2: 98%  Weight: 115 lb 1.6 oz (52.2 kg)  Height: '5\' 1"'$  (1.549 m)    GENERAL: The patient is a well-nourished female, in no acute distress. The vital signs are documented above. CARDIAC: There is a regular rate and rhythm.  VASCULAR: I do not detect carotid bruits. She has palpable pedal pulses. She has some small varicose veins bilaterally but more significantly on the left side. I did look at her left great saphenous vein myself with the SonoSite and she does have reflux down to the proximal calf.  The vein is about 4 mm in diameter throughout. PULMONARY: There is good air exchange bilaterally without wheezing or rales. ABDOMEN: Soft and non-tender with normal pitched bowel sounds.  MUSCULOSKELETAL: There are no major deformities or cyanosis. NEUROLOGIC: No focal weakness or paresthesias are detected. SKIN: There are no ulcers or rashes noted. PSYCHIATRIC: The patient has a normal  affect.  DATA:  VENOUS DUPLEX: I have reviewed her venous duplex scan that was done on 10-23.  This was of the left lower extremity only.  There was no evidence of DVT.  There was no significant deep venous reflux.  There was superficial venous reflux in the left great saphenous vein from the saphenofemoral junction to the proximal calf.  Diameters of the vein ranged from 3.6-4 mm.  The results of the study are summarized in the diagram below.    Deitra Mayo [facility] [phone]

## 2022-03-06 ENCOUNTER — Other Ambulatory Visit: Payer: Self-pay | Admitting: Internal Medicine

## 2022-03-06 DIAGNOSIS — Z1231 Encounter for screening mammogram for malignant neoplasm of breast: Secondary | ICD-10-CM

## 2022-04-10 ENCOUNTER — Ambulatory Visit (INDEPENDENT_AMBULATORY_CARE_PROVIDER_SITE_OTHER): Payer: Medicare Other | Admitting: Internal Medicine

## 2022-04-10 ENCOUNTER — Encounter: Payer: Self-pay | Admitting: Internal Medicine

## 2022-04-10 VITALS — BP 118/80 | HR 87 | Temp 98.0°F | Ht 61.0 in | Wt 110.0 lb

## 2022-04-10 DIAGNOSIS — R590 Localized enlarged lymph nodes: Secondary | ICD-10-CM | POA: Diagnosis not present

## 2022-04-10 NOTE — Patient Instructions (Addendum)
      Think about have a Ct scan of the neck.       Return if symptoms worsen or fail to improve.

## 2022-04-10 NOTE — Progress Notes (Signed)
Subjective:    Patient ID: Angela [name], female    DOB: [birth year], 67 y.o.   MRN: [id number]      HPI Angela [name] is here for  Chief Complaint  Patient presents with   Cyst    Patient reports feeling two knots on the back of her neck; Not pain noted     Has had intermittent break out/hives in the posterior neck that was itchy.  She think it may have been from her hair dresser not rinsing all the soap out of her hair.  It would flare up intermittently over the past few months.  It is all cleared up now.   At its worse about 3 months ago.      Felt a lump on the left posterior neck and then felt one on the left lower neck and one on the right side of her neck.   They are not painful.  She is not sure if they have changed in size.    2/6, 2/23 - had labs by integrative labs - has had chronically low wbc, low neutrophils, but with first labs WBC and neutrophils were low - repeat they were normal.  Had autoimmune labs done - neg.  Brought copy.     The past three years - bloating in lower abdomen. The last month - has stopped certain foods ( gluten free crackers and almond milk).  Has lost a little weight, no longer bloating.     Medications and allergies reviewed with patient and updated if appropriate.  Current Outpatient Medications on File Prior to Visit  Medication Sig Dispense Refill   b complex vitamins tablet Take 1 tablet by mouth daily.     fish oil-omega-3 fatty acids 1000 MG capsule Take 2 g by mouth daily.     liothyronine (CYTOMEL) 5 MCG tablet Take 10 mcg by mouth daily.     meloxicam (MOBIC) 15 MG tablet Take 1 tablet (15 mg total) by mouth daily as needed for pain. 30 tablet 0   Multiple Vitamin (MULTIVITAMIN) tablet Take 1 tablet by mouth daily.     Multiple Vitamins-Minerals (ZINC PO) Take 1 tablet by mouth daily.     NONFORMULARY OR COMPOUNDED ITEM Bi-Est 5 mg 70 to 30 ratio 2-4 clicks daily. Dispense 1 month supply 0RF. 1 each 0   NONFORMULARY OR  COMPOUNDED ITEM Progesterone SR 200 mg take 1 tablet daily. 30 each 0   Probiotic Product (PROBIOTIC PO) Take by mouth.     SYNTHROID 75 MCG tablet Take 1 tablet (75 mcg total) by mouth daily. 30 tablet 1   UNABLE TO FIND Testosterone 0.25 mg every day (compounded)     UNABLE TO FIND Quercetine 800 mg po 3 times per day     VITAMIN D PO Take by mouth. Vit d 5000 2 x daily     No current facility-administered medications on file prior to visit.    Review of Systems  Constitutional:  Negative for chills and fever.  HENT:  Positive for postnasal drip. Negative for congestion, ear pain, sinus pressure and sore throat.   Respiratory:  Negative for cough, shortness of breath and wheezing.        Objective:   Vitals:   04/10/22 1043  BP: 118/80  Pulse: 87  Temp: 98 F (36.7 C)  SpO2: 98%   BP Readings from Last 3 Encounters:  04/10/22 118/80  01/25/22 132/76  10/17/21 123/82   Wt Readings from Last 3 Encounters:  04/10/22 110 lb (49.9 kg)  01/25/22 115 lb 1.6 oz (52.2 kg)  10/17/21 116 lb (52.6 kg)   Body mass index is 20.78 kg/m.    Physical Exam Constitutional:      General: She is not in acute distress.    Appearance: Normal appearance. She is not ill-appearing.  HENT:     Head: Normocephalic and atraumatic.  Neck:     Comments: One left posterior occipital LN palpable. - non-tender Lymphadenopathy:     Cervical: Cervical adenopathy (posterior cervical lymphadenopathy - one node palpable on rigth, one on left) present.  Skin:    General: Skin is warm and dry.     Findings: No erythema, lesion or rash.  Neurological:     Mental Status: She is alert.            Assessment & Plan:    See Problem List for Assessment and Plan of chronic medical problems.

## 2022-04-10 NOTE — Assessment & Plan Note (Signed)
New 1 left posterior cervical lymph node, 1 left occipital lymph node, 1 right posterior cervical lymph node All nontender ?  Reactive to rash/hives/outbreak she had in her posterior hairline intermittently over the past few months Blood work done integrative health most recently shows normal WBC/normal CBC No concerning symptoms Discussed monitoring versus evaluating further with imaging versus ENT She will monitor for now let me know if she changes her mind-likely reactive in nature

## 2022-04-13 ENCOUNTER — Telehealth: Payer: Self-pay | Admitting: Internal Medicine

## 2022-04-13 DIAGNOSIS — R590 Localized enlarged lymph nodes: Secondary | ICD-10-CM

## 2022-04-13 NOTE — Telephone Encounter (Signed)
Pt called wanted Dr. Quay Burow know that she decided to go ahead and get the ct scan.  Please call pt with update.

## 2022-04-17 NOTE — Telephone Encounter (Signed)
CT scan of neck ordered.  We need to get this approved by her insurance company and then they will call to schedule.  Ordered for [facility].

## 2022-04-17 NOTE — Telephone Encounter (Signed)
Spoke with patient today. 

## 2022-04-19 ENCOUNTER — Ambulatory Visit: Payer: Self-pay

## 2022-04-21 ENCOUNTER — Ambulatory Visit (HOSPITAL_BASED_OUTPATIENT_CLINIC_OR_DEPARTMENT_OTHER)
Admission: RE | Admit: 2022-04-21 | Discharge: 2022-04-21 | Disposition: A | Discharge status: Home / Self Care | Payer: Medicare Other | Source: Ambulatory Visit | Attending: Internal Medicine | Admitting: Internal Medicine

## 2022-04-21 DIAGNOSIS — R590 Localized enlarged lymph nodes: Secondary | ICD-10-CM | POA: Insufficient documentation

## 2022-04-21 MED ORDER — IOHEXOL 300 MG/ML  SOLN
100.0000 mL | Freq: Once | INTRAMUSCULAR | Status: AC | PRN
  Administered 2022-04-21: 75 mL via INTRAVENOUS

## 2022-05-18 ENCOUNTER — Ambulatory Visit: Payer: Self-pay

## 2022-06-01 ENCOUNTER — Ambulatory Visit
Admission: RE | Admit: 2022-06-01 | Discharge: 2022-06-01 | Disposition: A | Discharge status: Home / Self Care | Payer: Self-pay | Source: Ambulatory Visit | Attending: Internal Medicine | Admitting: Internal Medicine

## 2022-06-01 DIAGNOSIS — Z1231 Encounter for screening mammogram for malignant neoplasm of breast: Secondary | ICD-10-CM

## 2022-06-07 ENCOUNTER — Ambulatory Visit: Payer: 59 | Admitting: Obstetrics and Gynecology

## 2022-07-27 ENCOUNTER — Encounter: Payer: Self-pay | Admitting: Obstetrics and Gynecology

## 2022-07-27 ENCOUNTER — Ambulatory Visit (INDEPENDENT_AMBULATORY_CARE_PROVIDER_SITE_OTHER): Payer: Medicare Other | Admitting: Obstetrics and Gynecology

## 2022-07-27 ENCOUNTER — Other Ambulatory Visit (HOSPITAL_COMMUNITY)
Admission: RE | Admit: 2022-07-27 | Discharge: 2022-07-27 | Disposition: A | Discharge status: Home / Self Care | Payer: Medicare Other | Source: Ambulatory Visit | Attending: Obstetrics and Gynecology | Admitting: Obstetrics and Gynecology

## 2022-07-27 VITALS — BP 122/74 | HR 68 | Ht 61.81 in | Wt 111.0 lb

## 2022-07-27 DIAGNOSIS — Z79890 Hormone replacement therapy (postmenopausal): Secondary | ICD-10-CM | POA: Diagnosis not present

## 2022-07-27 DIAGNOSIS — Z01419 Encounter for gynecological examination (general) (routine) without abnormal findings: Secondary | ICD-10-CM | POA: Insufficient documentation

## 2022-07-27 DIAGNOSIS — Z124 Encounter for screening for malignant neoplasm of cervix: Secondary | ICD-10-CM

## 2022-07-27 DIAGNOSIS — Z1151 Encounter for screening for human papillomavirus (HPV): Secondary | ICD-10-CM | POA: Diagnosis not present

## 2022-07-27 NOTE — Progress Notes (Signed)
67 y.o. G35P0003 Married Caucasian female here for breast and pelvic exam and pap.  She is also followed for osteopenia.   Noted dropped bladder, increased with increased activity.  Good bladder control. No accidental leakage of stool or constipation.  Has a hemorrhoid.   Is on HRT compounded through Dr. Oneida Arenas.  Taking estradiol every other day.   PCP:   Cheryll Cockayne, MD  Patient's last menstrual period was 02/12/2007 (exact date).           Sexually active: yes. The current method of family planning is postmenopausal female.    Exercising: yes, tennis Smoker:  no  Health Maintenance: Pap:  10/04/17 WNL hr hpv neg 09/06/16 WNL Hr hpv neg 08/30/15 WNL  History of abnormal Pap:  yes MMG:  06/02/22 density B Bi-rads 1 neg  Colonoscopy:  03/13/18 - due in 2030.  BMD:  11/23/21  Result  osteopenic  TDaP:  04/22/12.  PCP. Gardasil:   n/a HIV: no  Hep C: no Screening Labs: pcp   reports that she has never smoked. She has never used smokeless tobacco. She reports current alcohol use of about 7.0 standard drinks of alcohol per week. She reports that she does not use drugs.  Past Medical History:  Diagnosis Date   Abnormal Pap smear of cervix 07-31-13   --pt. 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     Multiple Vitamin (MULTIVITAMIN) tablet Take 1 tablet by mouth  daily.     Multiple Vitamins-Minerals (ZINC PO) Take 1 tablet by mouth daily.     NONFORMULARY OR COMPOUNDED ITEM Bi-Est 5 mg 70 to 30 ratio 2-4 clicks daily. Dispense 1 month supply 0RF. 1 each 0   Probiotic Product (PROBIOTIC PO) Take by mouth.     SYNTHROID 75 MCG tablet Take 1 tablet (75 mcg total) by mouth daily. 30 tablet 1   UNABLE TO FIND Testosterone 0.25 mg every day (compounded)     NONFORMULARY OR COMPOUNDED ITEM Progesterone SR 200 mg take 1 tablet daily. (Patient not taking: Reported on 07/27/2022) 30 each 0   No current facility-administered medications for this visit.    Family History  Problem Relation Age of Onset   Hypertension Mother    Osteoporosis Mother    Osteoarthritis Mother    Hyperlipidemia Father    Non-Hodgkin's lymphoma Father    Hypertension Maternal Grandmother    Stroke Maternal Grandmother    Osteoporosis Other    Colon cancer Neg Hx    Colon polyps Neg Hx    Esophageal cancer Neg Hx    Stomach cancer Neg Hx    Rectal cancer Neg Hx     Review of Systems  See HPI.   Exam:   BP 122/74   Pulse 68   Ht  5' 1.81" (1.57 m)   Wt 111 lb (50.3 kg)   LMP 02/12/2007 (Exact Date)   SpO2 100%   BMI 20.43 kg/m     General appearance: alert, cooperative and appears stated age Head: normocephalic, without obvious abnormality, atraumatic Neck: no adenopathy, supple, symmetrical, trachea midline and thyroid normal to inspection and palpation Lungs: clear to auscultation bilaterally Breasts: normal appearance, no masses or tenderness, No nipple retraction or dimpling, No nipple discharge or bleeding, No axillary adenopathy Heart: regular rate and rhythm Abdomen: soft, non-tender; no masses, no organomegaly Extremities: extremities normal, atraumatic, no cyanosis or edema Skin: skin color, texture, turgor normal. No rashes or lesions Lymph nodes: cervical, supraclavicular, and axillary nodes normal. Neurologic: grossly normal  Pelvic: External  genitalia:  no lesions              No abnormal inguinal nodes palpated.              Urethra:  normal appearing urethra with no masses, tenderness or lesions              Bartholins and Skenes: normal                 Vagina: normal appearing vagina with normal color and discharge, no lesions.  Minimal cystocele noted with valsalva maneuver.               Cervix: no lesions              Pap taken: yes Bimanual Exam:  Uterus: absent              Adnexa: no mass, fullness, tenderness              Rectal exam: yes.  Confirms.              Anus:  normal sphincter tone, no lesions  Chaperone was present for exam:  Babs Sciara, CMA  Assessment:   Well woman visit with gynecologic exam. On bioidentical HRT and testosterone therapy through outside provider.  Hx supracervical hysterectomy.  Ovaries remain.  Hx prior positive HR HPV and LGSIL. Minimal cystocele.  Osteopenia.  Plan: Mammogram screening discussed. Self breast awareness reviewed. Pap and HR HPV collected. Guidelines for Calcium, Vitamin D, regular exercise program including cardiovascular and weight bearing exercise. BMD report reviewed.  Next BMD in 2025.  HRT reviewed and increased risks of DVT, PE, MI, stroke, and breast cancer.  Benefits of decreasing risk of osteoporosis noted as well.  Fu in 2 years and prn.   30 min  total time was spent for this patient encounter, including preparation, face-to-face counseling with the patient, coordination of care, and documentation of the encounter.

## 2022-07-27 NOTE — Patient Instructions (Signed)

## 2022-07-28 LAB — CYTOLOGY - PAP
Comment: NEGATIVE
Diagnosis: NEGATIVE
High risk HPV: NEGATIVE

## 2023-01-01 ENCOUNTER — Encounter: Payer: Self-pay | Admitting: Internal Medicine

## 2023-01-01 NOTE — Progress Notes (Signed)
Subjective:    Patient ID: Angela [name], female    DOB: Oct 02, 1955, 67 y.o.   MRN: [id number]     HPI Abiola is here for follow up of her chronic medical problems.  She is no longer seeing her integrative health doctor and would we did take over her thyroid medication.  Her last blood work done was in [place] does have a copy.  Overall feels well and has no concerns.  Occ skipped beats, occ beating more than usual.   She is exercising regularly.     Medications and allergies reviewed with patient and updated if appropriate.  Current Outpatient Medications on File Prior to Visit  Medication Sig Dispense Refill   liothyronine (CYTOMEL) 5 MCG tablet Take 10 mcg by mouth daily.     Multiple Vitamin (MULTIVITAMIN) tablet Take 1 tablet by mouth daily.     Multiple Vitamins-Minerals (ZINC PO) Take 1 tablet by mouth daily.     NONFORMULARY OR COMPOUNDED ITEM Bi-Est 5 mg 70 to 30 ratio 2-4 clicks daily. Dispense 1 month supply 0RF. 1 each 0   NONFORMULARY OR COMPOUNDED ITEM Progesterone SR 200 mg take 1 tablet daily. 30 each 0   Probiotic Product (PROBIOTIC PO) Take by mouth.     SYNTHROID 75 MCG tablet Take 1 tablet (75 mcg total) by mouth daily. 30 tablet 1   UNABLE TO FIND Testosterone 0.25 mg every day (compounded)     No current facility-administered medications on file prior to visit.     Review of Systems  Constitutional:  Negative for fever.  HENT:  Positive for dental problem.   Respiratory:  Negative for cough, shortness of breath and wheezing.   Cardiovascular:  Negative for chest pain, palpitations and leg swelling.  Gastrointestinal:  Negative for abdominal pain, blood in stool, constipation, diarrhea and nausea.       No gerd  Musculoskeletal:  Positive for arthralgias (hands).  Neurological:  Negative for light-headedness and headaches.       Objective:   Vitals:   01/02/23 0911  BP: 102/60  Pulse: 79  Temp: 98.2 F (36.8 C)   SpO2: 98%   BP Readings from Last 3 Encounters:  01/02/23 102/60  07/27/22 122/74  04/10/22 118/80   Wt Readings from Last 3 Encounters:  01/02/23 110 lb (49.9 kg)  07/27/22 111 lb (50.3 kg)  04/10/22 110 lb (49.9 kg)   Body mass index is 20.24 kg/m.    Physical Exam Constitutional:      General: She is not in acute distress.    Appearance: Normal appearance.  HENT:     Head: Normocephalic and atraumatic.  Eyes:     Conjunctiva/sclera: Conjunctivae normal.  Cardiovascular:     Rate and Rhythm: Normal rate and regular rhythm.     Heart sounds: Normal heart sounds.  Pulmonary:     Effort: Pulmonary effort is normal. No respiratory distress.     Breath sounds: Normal breath sounds. No wheezing.  Musculoskeletal:     Cervical back: Neck supple.     Right lower leg: No edema.     Left lower leg: No edema.  Lymphadenopathy:     Cervical: No cervical adenopathy.  Skin:    General: Skin is warm and dry.     Findings: No rash.  Neurological:     Mental Status: She is alert. Mental status is at baseline.  Psychiatric:        Mood and Affect: Mood normal.  Behavior: Behavior normal.        Lab Results  Component Value Date   WBC 4.2 03/17/2020   HGB 13.2 03/17/2020   HCT 38.6 03/17/2020   PLT 204.0 03/17/2020   GLUCOSE 87 03/17/2020   CHOL 215 (H) 03/17/2020   TRIG 108.0 03/17/2020   HDL 83.80 03/17/2020   LDLCALC 109 (H) 03/17/2020   ALT 20 03/17/2020   AST 19 03/17/2020   NA 136 03/17/2020   K 3.8 03/17/2020   CL 102 03/17/2020   CREATININE 0.64 03/17/2020   BUN 18 03/17/2020   CO2 29 03/17/2020   TSH 0.15 (L) 03/17/2020   INR 0.9 06/03/2020   HGBA1C 5.3 02/08/2017     Assessment & Plan:    See Problem List for Assessment and Plan of chronic medical problems.   Follow-up annually

## 2023-01-02 ENCOUNTER — Ambulatory Visit (INDEPENDENT_AMBULATORY_CARE_PROVIDER_SITE_OTHER): Payer: Medicare Other | Admitting: Internal Medicine

## 2023-01-02 VITALS — BP 102/60 | HR 79 | Temp 98.2°F | Ht 61.81 in | Wt 110.0 lb

## 2023-01-02 DIAGNOSIS — E039 Hypothyroidism, unspecified: Secondary | ICD-10-CM

## 2023-01-02 DIAGNOSIS — Z79890 Hormone replacement therapy (postmenopausal): Secondary | ICD-10-CM | POA: Insufficient documentation

## 2023-01-02 DIAGNOSIS — R7989 Other specified abnormal findings of blood chemistry: Secondary | ICD-10-CM | POA: Diagnosis not present

## 2023-01-02 DIAGNOSIS — E559 Vitamin D deficiency, unspecified: Secondary | ICD-10-CM | POA: Diagnosis not present

## 2023-01-02 MED ORDER — SYNTHROID 75 MCG PO TABS: 75.0000 ug | ORAL_TABLET | Freq: Every day | ORAL | Status: DC

## 2023-01-02 NOTE — Assessment & Plan Note (Addendum)
Chronic Has been following with integrative medicine, but has decided not to go back and would like me to take over her medications Previous blood work: 10/10/2022: TSH 0. 012, FT4 1.02, FT3 2.4 02/21/22:       TSH 0.012, FT4 1.12, FT3 2.7 Clinically euthyroid Discussed with her that she has been hyperactive which is not good for her.  She does have occasional feeling of a skipped beat or maybe occasional palpitation which could be coming from that.  I am more concerned about her balance since she has severe osteopenia Decrease Synthroid to 75 mcg 6 days a week, continue Cytomel at 10 mcg daily Check TFTs in about 6 weeks

## 2023-01-02 NOTE — Assessment & Plan Note (Signed)
Chronic Has been on estrogen, progesterone and testosterone-managed by integrative health She will ask her gynecologist to take over her treatment

## 2023-01-02 NOTE — Assessment & Plan Note (Addendum)
Chronic Taking vitamin D daily Vitamin D level in September was in the normal range

## 2023-01-02 NOTE — Patient Instructions (Addendum)
      Blood work was ordered.   Have this in 6-8 weeks    Medications changes include :   None     Return in about 1 year (around 01/02/2024) for follow up.

## 2023-01-16 ENCOUNTER — Other Ambulatory Visit: Payer: Self-pay | Admitting: Internal Medicine

## 2023-01-16 DIAGNOSIS — E039 Hypothyroidism, unspecified: Secondary | ICD-10-CM

## 2023-01-16 MED ORDER — SYNTHROID 75 MCG PO TABS: 75.0000 ug | ORAL_TABLET | Freq: Every day | ORAL | 2 refills | Status: DC

## 2023-02-15 NOTE — Progress Notes (Unsigned)
GYNECOLOGY  VISIT   HPI: 68 y.o.   Married  Caucasian female   G3P0003 with Patient's last menstrual period was 02/12/2007 (exact date).   here for: discuss hormones     Using compounded HRT and testosterone.  She will not use her integrative MD any longer.  She goes to [employer].   Now is taking progesterone, which was added recently.  Notices she is sleeping better.  Is using bi-est vaginally instead of topically. BI-EST 1 mg/ml 950:50) CR.  Place 2 clicks vaginally 3 times a week.  Quantity:  30  Using testosterone cream 0.25 mg/ml Place 1 - 2 clicks (0.25 ml to 0.5 ml) daily. Quantity 30  Progesterone 100 mg capsule orally at night time.   No hot flashes.   No recent testosterone testing.    GYNECOLOGIC HISTORY: Patient's last menstrual period was 02/12/2007 (exact date). Contraception:  hyst Menopausal hormone therapy:  progesterone, testosterone Last 2 paps:  07/27/22 neg: HR HPV neg, 10/04/17 neg: HR HPV neg History of abnormal Pap or positive HPV:  yes Mammogram:  06/01/22 Breast Density cat B, BI-RADS CAT 1 neg        OB History     Gravida  3   Para  3   Term  0   Preterm  0   AB  0   Living  3      SAB  0   IAB  0   Ectopic  0   Multiple  0   Live Births  3              Patient Active Problem List   Diagnosis Date Noted   Hormone replacement therapy (postmenopausal) 01/02/2023   Lymphadenopathy, cervical 04/10/2022   Vitamin D deficiency 03/17/2020   Palpitations 03/17/2020   Ganglion cyst of tendon sheath of left hand 06/03/2019   Left shoulder pain 06/03/2019   Hypothyroidism 03/19/2018   Lung nodule, stable 2009-2012, no FU needed 10/02/2007   SYMPTOMATIC MENOPAUSAL/FEMALE CLIMACTERIC STATES 10/02/2007   Allergic rhinitis 10/24/2006   Osteopenia 10/24/2006    Past Medical History:  Diagnosis Date   Abnormal Pap smear of cervix 07-31-13   --pt. with supracervical hysterectomy--pap revealed LSIL with Pos. HR  HPV   Allergy    Arthritis    Chicken pox    Heart murmur    Osteopenia    Radial head fracture, closed 08/15/2017   Thyroid disease     Past Surgical History:  Procedure Laterality Date   ABDOMINAL HYSTERECTOMY     fibroids   BREAST CYST ASPIRATION     COLONOSCOPY     PILONIDAL CYST DRAINAGE  age 84   ROBOTIC ASSISTED SUPRACERVICAL HYSTERECTOMY  02/12/2007   Adenomyosis and fibroids; Ovaries remain; [facility]   TONSILLECTOMY AND ADENOIDECTOMY  child    Current Outpatient Medications  Medication Sig Dispense Refill   cholecalciferol (VITAMIN D3) 25 MCG (1000 UNIT) tablet Take 1,000 Units by mouth daily.     liothyronine (CYTOMEL) 5 MCG tablet Take 10 mcg by mouth daily.     NONFORMULARY OR COMPOUNDED ITEM Bi-Est 5 mg 70 to 30 ratio 2-4 clicks daily. Dispense 1 month supply 0RF. 1 each 0   NONFORMULARY OR COMPOUNDED ITEM Progesterone SR 200 mg take 1 tablet daily. 30 each 0   Omega-3 1400 MG CAPS Take by mouth.     Probiotic Product (PROBIOTIC PO) Take by mouth.     SYNTHROID 75 MCG tablet Take 1 tablet (  75 mcg total) by mouth daily. Take 6 days a week 30 tablet 2   UNABLE TO FIND Testosterone 0.25 mg every day (compounded)     No current facility-administered medications for this visit.     ALLERGIES: Patient has no known allergies.  Family History  Problem Relation Age of Onset   Hypertension Mother    Osteoporosis Mother    Osteoarthritis Mother    Hyperlipidemia Father    Non-Hodgkin's lymphoma Father    Hypertension Maternal Grandmother    Stroke Maternal Grandmother    Osteoporosis Other    Colon cancer Neg Hx    Colon polyps Neg Hx    Esophageal cancer Neg Hx    Stomach cancer Neg Hx    Rectal cancer Neg Hx     Social History   Socioeconomic History   Marital status: Married    Spouse name: Not on file   Number of children: Not on file   Years of education: Not on file   Highest education level: Not on file  Occupational History   Not on  file  Tobacco Use   Smoking status: Never   Smokeless tobacco: Never  Vaping Use   Vaping status: Never Used  Substance and Sexual Activity   Alcohol use: Yes    Alcohol/week: 7.0 standard drinks of alcohol    Types: 7 Glasses of wine per week   Drug use: No   Sexual activity: Yes    Partners: Male    Birth control/protection: Surgical    Comment: hysterectomy  Other Topics Concern   Not on file  Social History Narrative   Not on file   Social Drivers of Health   Financial Resource Strain: Not on file  Food Insecurity: Not on file  Transportation Needs: Not on file  Physical Activity: Not on file  Stress: Not on file  Social Connections: Not on file  Intimate Partner Violence: Not on file    Review of Systems  All other systems reviewed and are negative.   PHYSICAL EXAMINATION:   BP 126/82 (BP Location: Left Arm, Patient Position: Sitting, Cuff Size: Small)   Pulse 84   Ht 5' 1.75" (1.568 m)   Wt 116 lb (52.6 kg)   LMP 02/12/2007 (Exact Date)   SpO2 98%   BMI 21.39 kg/m     General appearance: alert, cooperative and appears stated age             ASSESSMENT:    PLAN:  Routine exam in July.  Send a copy of last lab hormone testing.   ***  total time was spent for this patient encounter, including preparation, face-to-face counseling with the patient, coordination of care, and documentation of the encounter.

## 2023-03-01 ENCOUNTER — Encounter: Payer: Self-pay | Admitting: Obstetrics and Gynecology

## 2023-03-01 ENCOUNTER — Ambulatory Visit (INDEPENDENT_AMBULATORY_CARE_PROVIDER_SITE_OTHER): Payer: Medicare Other | Admitting: Obstetrics and Gynecology

## 2023-03-01 VITALS — BP 126/82 | HR 84 | Ht 61.75 in | Wt 116.0 lb

## 2023-03-01 DIAGNOSIS — Z79890 Hormone replacement therapy (postmenopausal): Secondary | ICD-10-CM

## 2023-03-01 DIAGNOSIS — Z5181 Encounter for therapeutic drug level monitoring: Secondary | ICD-10-CM | POA: Diagnosis not present

## 2023-03-02 NOTE — Patient Instructions (Signed)
Menopause and HRT (Hormone Replacement Therapy): What to Expect Menopause is the time in your life when your menstrual period stops, and your ovaries stop making certain hormones. These hormones are called estrogen and progesterone. Low levels of these hormones can affect your health and cause symptoms. Hormone replacement therapy (HRT) is a treatment that replaces the estrogen and progesterone in your body. Types of HRT  There are many types and doses of HRT. Talk with your health care provider about what form of HRT is best for you. HRT may include: Estrogen and progestin. This is more common if you have a uterus. Estrogen-only therapy. This is used if you don't have a uterus. You may be given the HRT as: Pills. Patches. Gels. Sprays. A vaginal cream. Vaginal rings. Vaginal inserts. How many hormones you need to take and how long you should take them depend on your health. You should: Start HRT at the lowest possible dose. Stop HRT as soon as you're told to. Work with your provider so you feel informed and comfortable with what happens. Tell a health care provider about: Any allergies you have. Whether you've had blood clots or are at risk for blood clots. Whether you or family members have had cancer, such as cancer of: The breasts. The ovaries. The uterus. Any surgeries you've had. Any medical problems you have. All medicines you take. These include vitamins, herbs, eye drops, and creams. Whether you're pregnant or may be pregnant. What are the benefits? HRT can help with the symptoms of menopause. The benefits depend on: What symptoms you have. How bad your symptoms are. Your overall health. Symptoms of menopause that HRT can help with include: Hot flashes and night sweats. Hot flashes are sudden feelings of heat that spread over your face and body. Your skin may turn red, like a blush. Night sweats are hot flashes that happen when you're sleeping or trying to  sleep. Bone loss. The body loses calcium faster after menopause. This can cause your bones to be weaker. They may be more likely to break. Vaginal dryness. The lining of the vagina can get thin and dry, which can cause: Pain during sex. Infection. Burning. Itching. Urinary tract infections. Not being able to control when you pee. Irritability, which means getting annoyed easily. Short-term memory problems. What are the risks? Risks depend on your health and medical history. They also depend on if you get estrogen and progestin or estrogen-only therapy. HRT may make you more likely to have: Spotting. This is when a small amount of blood leaks from your vagina when you don't expect it to. Endometrial cancer. This is cancer of the lining of the uterus. Breast cancer. Higher density of breast tissue. This can make it harder to find breast cancer on an X-ray. A stroke. Heart disease. Blood clots. Gallbladder or liver disease. You may be more at risk for problems if you have: Endometrial cancer. Liver disease. Heart disease. Breast cancer. A history of blood clots. A history of stroke. Follow these instructions at home: Take your medicines only as told. Do not smoke, vape, or use nicotine or tobacco. Go to your provider as often as told to get: Mammograms. Pelvic exams. Medical checkups. Contact a health care provider if: Your legs hurt or swell. You feel lumps or changes in your breasts or armpits. You feel pain, burning, or bleeding when you pee. You have bleeding from your vagina that's not normal. You feel dizzy or get headaches. You have pain in your belly.  Get help right away if: You have shortness of breath. You have chest pain. You have slurred speech. Any part of your arms or legs feels weak or numb. These symptoms may be an emergency. Call 911 right away. Do not wait to see if the symptoms will go away. Do not drive yourself to the hospital. This information is  not intended to replace advice given to you by your health care provider. Make sure you discuss any questions you have with your health care provider. Document Revised: 09/01/2022 Document Reviewed: 09/01/2022 Elsevier Patient Education  2024 [employer].

## 2023-05-15 ENCOUNTER — Telehealth: Payer: Self-pay | Admitting: Internal Medicine

## 2023-05-15 NOTE — Telephone Encounter (Unsigned)
 Copied from CRM [id number]. Topic: Clinical - Prescription Issue >> May 14, 2023  4:58 PM Dyann Glaser G wrote: Reason for CRM: PT CALLED TO LET DR BURNS KNOW SHE IS READY FOR THESE TWO MEDS TO BE PRESCRIBED STATED THEY WERE BEING FILLED BY SOMEONE ELSE. SYNTHROID  75 MCG tablet AND liothyronine  (CYTOMEL ) 5 MCG ([facility] [address] SHE STATED).

## 2023-05-16 ENCOUNTER — Other Ambulatory Visit: Payer: Self-pay

## 2023-05-16 DIAGNOSIS — E039 Hypothyroidism, unspecified: Secondary | ICD-10-CM

## 2023-05-16 MED ORDER — LIOTHYRONINE SODIUM 5 MCG PO TABS: ORAL_TABLET | ORAL | 3 refills | Status: AC

## 2023-05-16 MED ORDER — SYNTHROID 75 MCG PO TABS: 75.0000 ug | ORAL_TABLET | Freq: Every day | ORAL | 4 refills | Status: AC

## 2023-05-16 NOTE — Telephone Encounter (Signed)
Scripts sent in today °

## 2023-06-12 ENCOUNTER — Encounter: Payer: Self-pay | Admitting: Family Medicine

## 2023-06-12 ENCOUNTER — Ambulatory Visit (INDEPENDENT_AMBULATORY_CARE_PROVIDER_SITE_OTHER): Admitting: Family Medicine

## 2023-06-12 VITALS — BP 128/72 | HR 63 | Temp 97.8°F | Ht 61.75 in | Wt 114.4 lb

## 2023-06-12 DIAGNOSIS — H1030 Unspecified acute conjunctivitis, unspecified eye: Secondary | ICD-10-CM | POA: Insufficient documentation

## 2023-06-12 DIAGNOSIS — J040 Acute laryngitis: Secondary | ICD-10-CM | POA: Insufficient documentation

## 2023-06-12 DIAGNOSIS — H1032 Unspecified acute conjunctivitis, left eye: Secondary | ICD-10-CM | POA: Diagnosis not present

## 2023-06-12 NOTE — Assessment & Plan Note (Signed)
 May continue to drink teas to help coat the throat I do not see anything that needs antibiotics

## 2023-06-12 NOTE — Assessment & Plan Note (Signed)
 Low concern for bacterial conjunctivitis Discussed using warm compresses Follow-up as needed

## 2023-06-12 NOTE — Progress Notes (Signed)
 Acute Office Visit  Subjective:     Patient ID: Angela [name], female    DOB: 08/05/1955, 68 y.o.   MRN: [id number]  Chief Complaint  Patient presents with   Acute Visit    Left eye, started yesterday, draining, No longer draining today. Last week on Friday, had low grade fevers, scratchy throat, over the weekend loss voice.    HPI Patient is in today for evaluation of scratchy throat, cough that began 3 days ago. Has tried Advil and Sudafed, zinc lozenges, elderberry, vitamin C with some relief. States that yesterday she was having some discharge from the left eye, states that the left eye feels a bit itchy, not painful, and appears to be better today. Reports that she does have some laryngitis, states she went to a wedding over the weekend.  Reports that other symptoms have seemed to resolve.  Denies known sick contacts. Denies abdominal pain, nausea, vomiting, diarrhea, rash, fever, chills, other symptoms.  Medical hx as outlined below.  ROS Per HPI      Objective:    BP 128/72 (BP Location: Left Arm, Patient Position: Sitting)   Pulse 63   Temp 97.8 F (36.6 C) (Temporal)   Ht 5' 1.75" (1.568 m)   Wt 114 lb 6.4 oz (51.9 kg)   LMP 02/12/2007 (Exact Date)   SpO2 97%   BMI 21.09 kg/m    Physical Exam Vitals and nursing note reviewed.  Constitutional:      General: She is not in acute distress.    Appearance: Normal appearance. She is normal weight.     Comments: Appears fatigued  HENT:     Head: Normocephalic and atraumatic.     Right Ear: External ear normal.     Left Ear: External ear normal. A middle ear effusion is present.     Nose: Nose normal. No congestion.     Mouth/Throat:     Mouth: Mucous membranes are moist.     Pharynx: Oropharynx is clear. No oropharyngeal exudate or posterior oropharyngeal erythema.  Eyes:     General:        Left eye: Discharge (Clear, watery) present.    Extraocular Movements: Extraocular movements intact.      Pupils: Pupils are equal, round, and reactive to light.     Comments: Mild erythema to sclera of the left eye  Cardiovascular:     Rate and Rhythm: Normal rate and regular rhythm.     Pulses: Normal pulses.     Heart sounds: Normal heart sounds.  Pulmonary:     Effort: Pulmonary effort is normal. No respiratory distress.     Breath sounds: Normal breath sounds. No wheezing, rhonchi or rales.  Musculoskeletal:        General: Normal range of motion.     Cervical back: Normal range of motion and neck supple.     Right lower leg: No edema.     Left lower leg: No edema.  Lymphadenopathy:     Cervical: Cervical adenopathy (Left side) present.  Skin:    General: Skin is warm and dry.  Neurological:     General: No focal deficit present.     Mental Status: She is alert and oriented to person, place, and time.  Psychiatric:        Mood and Affect: Mood normal.        Thought Content: Thought content normal.     No results found for any visits on 06/12/23.  Assessment & Plan:   Laryngitis Assessment & Plan: May continue to drink teas to help coat the throat I do not see anything that needs antibiotics   Acute conjunctivitis of left eye, unspecified acute conjunctivitis type Assessment & Plan: Low concern for bacterial conjunctivitis Discussed using warm compresses Follow-up as needed      No orders of the defined types were placed in this encounter.   Return if symptoms worsen or fail to improve.  Wellington Half, FNP

## 2023-06-12 NOTE — Patient Instructions (Signed)
 Continue supportive care at home for your laryngitis with teas and lozenges as you have been doing.  I do not think that you have anything bacterial that would require an antibiotic at this time.  Follow-up with me as needed if symptoms persist or worsen.

## 2023-06-14 ENCOUNTER — Encounter: Payer: Self-pay | Admitting: Internal Medicine

## 2023-06-14 ENCOUNTER — Ambulatory Visit: Payer: Self-pay

## 2023-06-14 NOTE — Telephone Encounter (Signed)
 Chief Complaint: eye discharge Symptoms: eye discharge and headache Frequency: Monday Pertinent Negatives: Patient denies fever Disposition: [] ED /[] Urgent Care (no appt availability in office) / [] Appointment(In office/virtual)/ []  [facility]/ [x] Home Care/ [] Refused Recommended Disposition /[] [facility]/ []  Follow-up with PCP  Additional Notes: Pt states that she was seen on Tuesday and not giving anything. States it started in the left eye and now it is also in the right eye. States that this morning she also woke up with a headache. States that discharge is thick clear  and white and eyes are crusty in the mornings. States that eyes are very itchy. States that drainage is not much during the day.   Copied from CRM [id number]. Topic: Clinical - Red Word Triage >> Jun 14, 2023  8:37 AM Alethia Huxley E wrote: [facility] that prompted transfer to Nurse Triage: Conjunctivitis. Patient has conjunctivitis in both eyes where they are itchy and some mucous is discharging from eyes. Patient also is having headaches. Reason for Disposition . [1] Very small amount of discharge AND [2] only in corner of eye  Protocols used: Eye - Pus or Discharge-A-AH

## 2023-06-14 NOTE — Progress Notes (Deleted)
    Subjective:    Patient ID: Angela [name], female    DOB: [birth year], 68 y.o.   MRN: [id number]      HPI Angela [name] is here for No chief complaint on file.   She was seen 5/27 by Trevor Fudge.  She was experiencing cold symptoms and was diagnosed with laryngitis and acute conjunctivitis of the left eye.  It was not felt that she had bacterial conjunctivitis and was advised warm compresses.      Medications and allergies reviewed with patient and updated if appropriate.  Current Outpatient Medications on File Prior to Visit  Medication Sig Dispense Refill   cholecalciferol (VITAMIN D3) 25 MCG (1000 UNIT) tablet Take 1,000 Units by mouth daily.     liothyronine  (CYTOMEL ) 5 MCG tablet Take 5 mcg BID (10 mcg) by mouth daily. 60 tablet 3   NONFORMULARY OR COMPOUNDED ITEM Bi-Est 5 mg 70 to 30 ratio 2-4 clicks daily. Dispense 1 month supply 0RF. 1 each 0   NONFORMULARY OR COMPOUNDED ITEM Progesterone  SR 200 mg take 1 tablet daily. 30 each 0   Omega-3 1400 MG CAPS Take by mouth.     Probiotic Product (PROBIOTIC PO) Take by mouth.     SYNTHROID  75 MCG tablet Take 1 tablet (75 mcg total) by mouth daily. Take 6 days a week 30 tablet 4   UNABLE TO FIND Testosterone  0.25 mg every day (compounded)     No current facility-administered medications on file prior to visit.    Review of Systems     Objective:  There were no vitals filed for this visit. BP Readings from Last 3 Encounters:  06/12/23 128/72  03/01/23 126/82  01/02/23 102/60   Wt Readings from Last 3 Encounters:  06/12/23 114 lb 6.4 oz (51.9 kg)  03/01/23 116 lb (52.6 kg)  01/02/23 110 lb (49.9 kg)   There is no height or weight on file to calculate BMI.    Physical Exam         Assessment & Plan:    See Problem List for Assessment and Plan of chronic medical problems.

## 2023-06-15 ENCOUNTER — Ambulatory Visit: Admitting: Internal Medicine

## 2023-06-18 ENCOUNTER — Other Ambulatory Visit: Payer: Self-pay | Admitting: Obstetrics and Gynecology

## 2023-06-18 DIAGNOSIS — Z1231 Encounter for screening mammogram for malignant neoplasm of breast: Secondary | ICD-10-CM

## 2023-07-06 ENCOUNTER — Ambulatory Visit

## 2023-07-13 ENCOUNTER — Ambulatory Visit
Admission: RE | Admit: 2023-07-13 | Discharge: 2023-07-13 | Disposition: A | Discharge status: Home / Self Care | Source: Ambulatory Visit | Attending: Obstetrics and Gynecology | Admitting: Obstetrics and Gynecology

## 2023-07-13 DIAGNOSIS — Z1231 Encounter for screening mammogram for malignant neoplasm of breast: Secondary | ICD-10-CM

## 2023-07-19 ENCOUNTER — Ambulatory Visit: Payer: Self-pay | Admitting: Obstetrics and Gynecology

## 2023-09-06 NOTE — Progress Notes (Unsigned)
 GYNECOLOGY  VISIT   HPI: 68 y.o.   Married  Caucasian female   [id number] with Patient's last menstrual period was 02/12/2007 (exact date).   here for: 1 year med check - testosterone , and estradiol  (Bi-Est). States she stopped taking progesterone .  On HRT for about 10 years.     Some decreased energy.  Hypothyroidism.   Skipped some estrogen doses.      Her prescription are as follows: - Bi-est vaginally instead of topically. BI-EST 1 mg/ml (50:50) CR.  Place 2 clicks vaginally 3 times a week.  Quantity:  30   - Testosterone  cream 0.25 mg/ml Place 1 - 2 clicks (0.25 ml to 0.5 ml) daily. Quantity 30  GYNECOLOGIC HISTORY: Patient's last menstrual period was 02/12/2007 (exact date). Contraception:  Hyst Menopausal hormone therapy:  testosterone  & estradiol  (Bi-Est).  Last 2 paps:  07/27/22 neg HR HPV neg, 10/04/17 neg HPV neg History of abnormal Pap or positive HPV:  no Mammogram:  07/13/23 Breast Density Cat B, BIRADS Cat 1 neg   BMD 11/23/21:  osteopenia.  FRAX 11.2%/2/1%        OB History     Gravida  3   Para  3   Term  0   Preterm  0   AB  0   Living  3      SAB  0   IAB  0   Ectopic  0   Multiple  0   Live Births  3              Patient Active Problem List   Diagnosis Date Noted   Acute conjunctivitis 06/12/2023   Laryngitis 06/12/2023   Hormone replacement therapy (postmenopausal) 01/02/2023   Lymphadenopathy, cervical 04/10/2022   Vitamin D  deficiency 03/17/2020   Palpitations 03/17/2020   Ganglion cyst of tendon sheath of left hand 06/03/2019   Left shoulder pain 06/03/2019   Hypothyroidism 03/19/2018   Lung nodule, stable 2009-2012, no FU needed 10/02/2007   SYMPTOMATIC MENOPAUSAL/FEMALE CLIMACTERIC STATES 10/02/2007   Allergic rhinitis 10/24/2006   Osteopenia 10/24/2006    Past Medical History:  Diagnosis Date   Abnormal Pap smear of cervix 07-31-13   --pt. 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    Allergy    Arthritis    Chicken pox    Heart murmur    Osteopenia    Radial head fracture, closed 08/15/2017   Thyroid  disease     Past Surgical History:  Procedure Laterality Date   ABDOMINAL HYSTERECTOMY     fibroids   BREAST CYST ASPIRATION     COLONOSCOPY     PILONIDAL CYST DRAINAGE  age 42   ROBOTIC ASSISTED SUPRACERVICAL HYSTERECTOMY  02/12/2007   Adenomyosis and fibroids; Ovaries remain; [place]    TONSILLECTOMY AND ADENOIDECTOMY  child    Current Outpatient Medications  Medication Sig Dispense Refill   cholecalciferol (VITAMIN D3) 25 MCG (1000 UNIT) tablet Take 1,000 Units by mouth daily.     liothyronine  (CYTOMEL ) 5 MCG tablet Take 5 mcg BID (10 mcg) by mouth daily. 60 tablet 3   Omega-3 1400 MG CAPS Take by mouth.     Probiotic Product (PROBIOTIC PO) Take by mouth.     SYNTHROID  75 MCG tablet Take 1 tablet (75 mcg total) by mouth daily. Take 6 days a week 30 tablet 4   UNABLE TO FIND Testosterone  0.25 mg every day (compounded)     No current facility-administered medications for this visit.  ALLERGIES: Patient has no known allergies.  Family History  Problem Relation Age of Onset   Hypertension Mother    Osteoporosis Mother    Osteoarthritis Mother    Hyperlipidemia Father    Non-Hodgkin's lymphoma Father    Hypertension Maternal Grandmother    Stroke Maternal Grandmother    Osteoporosis Other    Colon cancer Neg Hx    Colon polyps Neg Hx    Esophageal cancer Neg Hx    Stomach cancer Neg Hx    Rectal cancer Neg Hx     Social History   Socioeconomic History   Marital status: Married    Spouse name: Not on file   Number of children: Not on file   Years of education: Not on file   Highest education level: Not on file  Occupational History   Not on file  Tobacco Use   Smoking status: Never   Smokeless tobacco: Never  Vaping Use   Vaping status: Never Used  Substance and Sexual Activity   Alcohol use: Yes    Alcohol/week: 7.0  standard drinks of alcohol    Types: 7 Glasses of wine per week   Drug use: No   Sexual activity: Yes    Partners: Male    Birth control/protection: Surgical    Comment: hysterectomy  Other Topics Concern   Not on file  Social History Narrative   Not on file   Social Drivers of Health   Financial Resource Strain: Low Risk  (09/07/2023)   Overall Financial Resource Strain (CARDIA)    Difficulty of Paying Living Expenses: Not hard at all  Food Insecurity: No Food Insecurity (09/07/2023)   Hunger Vital Sign    Worried About Running Out of Food in the Last Year: Never true    Ran Out of Food in the Last Year: Never true  Transportation Needs: No Transportation Needs (09/07/2023)   PRAPARE - Transportation    Lack of Transportation (Medical): No    Lack of Transportation (Non-Medical): No  Physical Activity: Sufficiently Active (09/07/2023)   Exercise Vital Sign    Days of Exercise per Week: 6 days    Minutes of Exercise per Session: 40 min  Stress: No Stress Concern Present (09/07/2023)   [employer] of Occupational Health - Occupational Stress Questionnaire    Feeling of Stress: Not at all  Social Connections: Socially Integrated (09/07/2023)   Social Connection and Isolation Panel    Frequency of Communication with Friends and Family: More than three times a week    Frequency of Social Gatherings with Friends and Family: More than three times a week    Attends Religious Services: More than 4 times per year    Active Member of [employer] or Organizations: Yes    Attends Banker Meetings: More than 4 times per year    Marital Status: Married  Catering manager Violence: Not At Risk (09/07/2023)   Humiliation, Afraid, Rape, and Kick questionnaire    Fear of Current or Ex-Partner: No    Emotionally Abused: No    Physically Abused: No    Sexually Abused: No    Review of Systems  All other systems reviewed and are negative.   PHYSICAL EXAMINATION:   BP 126/76 (BP  Location: Left Arm, Patient Position: Sitting)   Pulse 72   Ht 5' 1.75 (1.568 m)   Wt 112 lb (50.8 kg)   LMP 02/12/2007 (Exact Date)   SpO2 97%   BMI 20.65 kg/m  General appearance: alert, cooperative and appears stated age Head: Normocephalic, without obvious abnormality, atraumatic Neck: no adenopathy, supple, symmetrical, trachea midline and thyroid  normal to inspection and palpation Lungs: clear to auscultation bilaterally Breasts: normal appearance, no masses or tenderness, No nipple retraction or dimpling, No nipple discharge or bleeding, No axillary or supraclavicular adenopathy Heart: regular rate and rhythm Abdomen: soft, non-tender, no masses,  no organomegaly Extremities: extremities normal, atraumatic, no cyanosis or edema Skin: Skin color, texture, turgor normal. No rashes or lesions Lymph nodes: Cervical, supraclavicular, and axillary nodes normal. No abnormal inguinal nodes palpated Neurologic: Grossly normal  Pelvic: External genitalia:  no lesions              Urethra:  normal appearing urethra with no masses, tenderness or lesions              Bartholins and Skenes: normal                 Vagina: normal appearing vagina with normal color and discharge, no lesions              Cervix: no lesions.  First degree cystocele and cervical prolapse.                  Bimanual Exam:  Uterus:  absent.               Adnexa: no mass, fullness, tenderness              Rectal exam: {yes no:314532}.  Confirms.              Anus:  normal sphincter tone, no lesions  Chaperone was present for exam:  {BSCHAPERONE:31226::Emily F, CMA}  ASSESSMENT:  Status post supracervical hysterectomy. HRT.  Testosterone  therapy.   PLAN:    {LABS (Optional):23779}  ***  total time was spent for this patient encounter, including preparation, face-to-face counseling with the patient, coordination of care, and documentation of the encounter.

## 2023-09-07 ENCOUNTER — Ambulatory Visit

## 2023-09-07 VITALS — Ht 61.0 in | Wt 114.0 lb

## 2023-09-07 DIAGNOSIS — Z0000 Encounter for general adult medical examination without abnormal findings: Secondary | ICD-10-CM

## 2023-09-07 NOTE — Progress Notes (Signed)
 Subjective:   Angela [name] is a 68 y.o. who presents for a Medicare Wellness preventive visit.  As a reminder, Annual Wellness Visits don't include a physical exam, and some assessments may be limited, especially if this visit is performed virtually. We may recommend an in-person follow-up visit with your provider if needed.  Visit Complete: Virtual I connected with  Angela [name] on 09/07/23 by a audio enabled telemedicine application and verified that I am speaking with the correct person using two identifiers.  Patient Location: Home  Provider Location: Office/Clinic  I discussed the limitations of evaluation and management by telemedicine. The patient expressed understanding and agreed to proceed.  Vital Signs: Because this visit was a virtual/telehealth visit, some criteria may be missing or patient reported. Any vitals not documented were not able to be obtained and vitals that have been documented are patient reported.  VideoDeclined- This patient declined Librarian, academic. Therefore the visit was completed with audio only.  Persons Participating in Visit: Patient.  AWV Questionnaire: No: Patient Medicare AWV questionnaire was not completed prior to this visit.  Cardiac Risk Factors include: advanced age (>53men, >30 women)     Objective:    Today's Vitals   09/07/23 1302  Weight: 114 lb (51.7 kg)  Height: 5' 1 (1.549 m)   Body mass index is 21.54 kg/m.     09/07/2023    1:03 PM  Advanced Directives  Does Patient Have a Medical Advance Directive? Yes  Type of Estate agent of [place];Living will  Copy of Healthcare Power of Attorney in Chart? No - copy requested    Current Medications (verified) Outpatient Encounter Medications as of 09/07/2023  Medication Sig   cholecalciferol (VITAMIN D3) 25 MCG (1000 UNIT) tablet Take 1,000 Units by mouth daily.   liothyronine  (CYTOMEL ) 5 MCG  tablet Take 5 mcg BID (10 mcg) by mouth daily.   NONFORMULARY OR COMPOUNDED ITEM Bi-Est 5 mg 70 to 30 ratio 2-4 clicks daily. Dispense 1 month supply 0RF.   NONFORMULARY OR COMPOUNDED ITEM Progesterone  SR 200 mg take 1 tablet daily.   Omega-3 1400 MG CAPS Take by mouth.   Probiotic Product (PROBIOTIC PO) Take by mouth.   SYNTHROID  75 MCG tablet Take 1 tablet (75 mcg total) by mouth daily. Take 6 days a week   UNABLE TO FIND Testosterone  0.25 mg every day (compounded)   No facility-administered encounter medications on file as of 09/07/2023.    Allergies (verified) Patient has no known allergies.   History: Past Medical History:  Diagnosis Date   Abnormal Pap smear of cervix 07-31-13   --pt. 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   Allergy    Arthritis    Chicken pox    Heart murmur    Osteopenia    Radial head fracture, closed 08/15/2017   Thyroid  disease    Past Surgical History:  Procedure Laterality Date   ABDOMINAL HYSTERECTOMY     fibroids   BREAST CYST ASPIRATION     COLONOSCOPY     PILONIDAL CYST DRAINAGE  age 6   ROBOTIC ASSISTED SUPRACERVICAL HYSTERECTOMY  02/12/2007   Adenomyosis and fibroids; Ovaries remain; [place]    TONSILLECTOMY AND ADENOIDECTOMY  child   Family History  Problem Relation Age of Onset   Hypertension Mother    Osteoporosis Mother    Osteoarthritis Mother    Hyperlipidemia Father    Non-Hodgkin's lymphoma Father    Hypertension Maternal Grandmother  Stroke Maternal Grandmother    Osteoporosis Other    Colon cancer Neg Hx    Colon polyps Neg Hx    Esophageal cancer Neg Hx    Stomach cancer Neg Hx    Rectal cancer Neg Hx    Social History   Socioeconomic History   Marital status: Married    Spouse name: Not on file   Number of children: Not on file   Years of education: Not on file   Highest education level: Not on file  Occupational History   Not on file  Tobacco Use   Smoking status:  Never   Smokeless tobacco: Never  Vaping Use   Vaping status: Never Used  Substance and Sexual Activity   Alcohol use: Yes    Alcohol/week: 7.0 standard drinks of alcohol    Types: 7 Glasses of wine per week   Drug use: No   Sexual activity: Yes    Partners: Male    Birth control/protection: Surgical    Comment: hysterectomy  Other Topics Concern   Not on file  Social History Narrative   Not on file   Social Drivers of Health   Financial Resource Strain: Low Risk  (09/07/2023)   Overall Financial Resource Strain (CARDIA)    Difficulty of Paying Living Expenses: Not hard at all  Food Insecurity: No Food Insecurity (09/07/2023)   Hunger Vital Sign    Worried About Running Out of Food in the Last Year: Never true    Ran Out of Food in the Last Year: Never true  Transportation Needs: No Transportation Needs (09/07/2023)   PRAPARE - Transportation    Lack of Transportation (Medical): No    Lack of Transportation (Non-Medical): No  Physical Activity: Sufficiently Active (09/07/2023)   Exercise Vital Sign    Days of Exercise per Week: 6 days    Minutes of Exercise per Session: 40 min  Stress: No Stress Concern Present (09/07/2023)   [employer] of Occupational Health - Occupational Stress Questionnaire    Feeling of Stress: Not at all  Social Connections: Socially Integrated (09/07/2023)   Social Connection and Isolation Panel    Frequency of Communication with Friends and Family: More than three times a week    Frequency of Social Gatherings with Friends and Family: More than three times a week    Attends Religious Services: More than 4 times per year    Active Member of [employer] or Organizations: Yes    Attends Engineer, structural: More than 4 times per year    Marital Status: Married    Tobacco Counseling Counseling given: Not Answered    Clinical Intake:  Pre-visit preparation completed: Yes  Pain : No/denies pain     BMI - recorded: 21.54 Nutritional  Status: BMI of 19-24  Normal Nutritional Risks: None Diabetes: No  Lab Results  Component Value Date   HGBA1C 5.3 02/08/2017     How often do you need to have someone help you when you read instructions, pamphlets, or other written materials from your doctor or pharmacy?: 1 - Never  Interpreter Needed?: No  Information entered by :: Angela [name], CMA   Activities of Daily Living     09/07/2023    1:06 PM  In your present state of health, do you have any difficulty performing the following activities:  Hearing? 0  Vision? 0  Difficulty concentrating or making decisions? 0  Walking or climbing stairs? 0  Dressing or bathing? 0  Doing errands,  shopping? 0  Preparing Food and eating ? N  Using the Toilet? N  In the past six months, have you accidently leaked urine? N  Do you have problems with loss of bowel control? N  Managing your Medications? N  Managing your Finances? N  Housekeeping or managing your Housekeeping? N    Patient Care Team: Geofm Glade PARAS, MD as PCP - General (Internal Medicine) Gail Bergeron, MD (Family Medicine) Cathlyn JAYSON Cary, Bobie BRAVO, MD as Obstetrician (Obstetrics and Gynecology) Debarah Lorrene DEL., MD (Ophthalmology)  I have updated your Care Teams any recent Medical Services you may have received from other providers in the past year.     Assessment:   This is a routine wellness examination for Angela [name].  Hearing/Vision screen Hearing Screening - Comments:: Denies hearing difficulties   Vision Screening - Comments:: Wears rx glasses and contact lenses - up to date with routine eye exams with Dr Milissa   Goals Addressed               This Visit's Progress     Patient Stated (pt-stated)        Patient stated she plans to continue exercising and travel in addition to watching diet       Depression Screen     09/07/2023    1:07 PM 01/02/2023    9:18 AM 04/10/2022   10:50 AM 03/17/2020   11:47 AM 07/09/2018    9:26 AM 09/12/2016    2:00 PM   PHQ 2/9 Scores  PHQ - 2 Score 0 0 0 1 0 0  PHQ- 9 Score 0 1 3 3       Fall Risk     09/07/2023    1:06 PM 01/02/2023    9:17 AM 04/10/2022   10:50 AM 03/17/2020   11:21 AM 07/09/2018    9:25 AM  Fall Risk   Falls in the past year? 0 0 0 0 0   Number falls in past yr: 0 0 0 0 0   Injury with Fall? 0 0 0 0   Risk for fall due to : No Fall Risks No Fall Risks No Fall Risks No Fall Risks   Follow up Falls evaluation completed;Falls prevention discussed Falls evaluation completed Falls evaluation completed       Data saved with a previous flowsheet row definition    MEDICARE RISK AT HOME:  Medicare Risk at Home Any stairs in or around the home?: Yes If so, are there any without handrails?: No Home free of loose throw rugs in walkways, pet beds, electrical cords, etc?: Yes Adequate lighting in your home to reduce risk of falls?: Yes Life alert?: No Use of a cane, walker or w/c?: No Grab bars in the bathroom?: Yes Shower chair or bench in shower?: Yes Elevated toilet seat or a handicapped toilet?: Yes  TIMED UP AND GO:  Was the test performed?  No  Cognitive Function: 6CIT completed        09/07/2023    1:19 PM  6CIT Screen  What Year? 0 points  What month? 0 points  What time? 0 points  Count back from 20 0 points  Months in reverse 0 points  Repeat phrase 2 points  Total Score 2 points    Immunizations Immunization History  Administered Date(s) Administered   Influenza,inj,Quad PF,6+ Mos 10/31/2016, 11/08/2017, 11/22/2020   Janssen (J&J) SARS-COV-2 Vaccination 03/28/2019   Td 01/16/2002   Tdap 04/22/2012    Screening Tests Health Maintenance  Topic Date Due   Zoster Vaccines- Shingrix (1 of 2) Never done   COVID-19 Vaccine (2 - 2024-25 season) 09/17/2022   INFLUENZA VACCINE  08/17/2023   DTaP/Tdap/Td (3 - Td or Tdap) 01/02/2024 (Originally 04/23/2022)   Pneumococcal Vaccine: 50+ Years (1 of 1 - PCV) 01/02/2024 (Originally 12/25/2006)   Medicare Annual  Wellness (AWV)  09/06/2024   MAMMOGRAM  07/12/2025   Colonoscopy  03/13/2028   DEXA SCAN  Completed   HPV VACCINES  Aged Out   Meningococcal B Vaccine  Aged Out   Hepatitis C Screening  Discontinued    Health Maintenance  Health Maintenance Due  Topic Date Due   Zoster Vaccines- Shingrix (1 of 2) Never done   COVID-19 Vaccine (2 - 2024-25 season) 09/17/2022   INFLUENZA VACCINE  08/17/2023   Health Maintenance Items Addressed: 09/07/2023  Additional Screening:  Vision Screening: Recommended annual ophthalmology exams for early detection of glaucoma and other disorders of the eye. Would you like a referral to an eye doctor? No    Dental Screening: Recommended annual dental exams for proper oral hygiene  Community Resource Referral / Chronic Care Management: CRR required this visit?  No   CCM required this visit?  No   Plan:    I have personally reviewed and noted the following in the patient's chart:   Medical and social history Use of alcohol, tobacco or illicit drugs  Current medications and supplements including opioid prescriptions. Patient is not currently taking opioid prescriptions. Functional ability and status Nutritional status Physical activity Advanced directives List of other physicians Hospitalizations, surgeries, and ER visits in previous 12 months Vitals Screenings to include cognitive, depression, and falls Referrals and appointments  In addition, I have reviewed and discussed with patient certain preventive protocols, quality metrics, and best practice recommendations. A written personalized care plan for preventive services as well as general preventive health recommendations were provided to patient.   Angela [name], CMA   09/07/2023   After Visit Summary: (MyChart) Due to this being a telephonic visit, the after visit summary with patients personalized plan was offered to patient via MyChart   Notes: Scheduled a Physical for pt w/PCP for  12/2023.

## 2023-09-07 NOTE — Patient Instructions (Addendum)
 Ms. Girardot , Thank you for taking time out of your busy schedule to complete your Annual Wellness Visit with me. I enjoyed our conversation and look forward to speaking with you again next year. I, as well as your care team,  appreciate your ongoing commitment to your health goals. Please review the following plan we discussed and let me know if I can assist you in the future. Your Game plan/ To Do List    Referrals: If you haven't heard from the office you've been referred to, please reach out to them at the phone provided.   Follow up Visits: We will see or speak with you next year for your Next Medicare AWV with our clinical staff Have you seen your provider in the last 6 months (3 months if uncontrolled diabetes)? No  Clinician Recommendations:  Aim for 30 minutes of exercise or brisk walking, 6-8 glasses of water, and 5 servings of fruits and vegetables each day. Educated and advised on getting the Shingles, Pneumonia, Tdap, and Influenza vaccines in 2025.      This is a list of the screenings recommended for you:  Health Maintenance  Topic Date Due   Zoster (Shingles) Vaccine (1 of 2) Never done   COVID-19 Vaccine (2 - 2024-25 season) 09/17/2022   Flu Shot  08/17/2023   DTaP/Tdap/Td vaccine (3 - Td or Tdap) 01/02/2024*   Pneumococcal Vaccine for age over 51 (1 of 1 - PCV) 01/02/2024*   Medicare Annual Wellness Visit  09/06/2024   Mammogram  07/12/2025   Colon Cancer Screening  03/13/2028   DEXA scan (bone density measurement)  Completed   HPV Vaccine  Aged Out   Meningitis B Vaccine  Aged Out   Hepatitis C Screening  Discontinued  *Topic was postponed. The date shown is not the original due date.    Advanced directives: (Copy Requested) Please bring a copy of your health care power of attorney and living will to the office to be added to your chart at your convenience. You can mail to [facility] [address] [place], [place] [address] or email to  [email or web address] Advance Care Planning is important because it:  [x]  Makes sure you receive the medical care that is consistent with your values, goals, and preferences  [x]  It provides guidance to your family and loved ones and reduces their decisional burden about whether or not they are making the right decisions based on your wishes.  Follow the link provided in your after visit summary or read over the paperwork we have mailed to you to help you started getting your Advance Directives in place. If you need assistance in completing these, please reach out to us  so that we can help you!

## 2023-09-10 ENCOUNTER — Encounter: Payer: Self-pay | Admitting: Obstetrics and Gynecology

## 2023-09-10 ENCOUNTER — Ambulatory Visit (INDEPENDENT_AMBULATORY_CARE_PROVIDER_SITE_OTHER): Admitting: Obstetrics and Gynecology

## 2023-09-10 VITALS — BP 126/76 | HR 72 | Ht 61.75 in | Wt 112.0 lb

## 2023-09-10 DIAGNOSIS — Z5181 Encounter for therapeutic drug level monitoring: Secondary | ICD-10-CM

## 2023-09-10 DIAGNOSIS — Z780 Asymptomatic menopausal state: Secondary | ICD-10-CM | POA: Diagnosis not present

## 2023-09-10 DIAGNOSIS — Z79890 Hormone replacement therapy (postmenopausal): Secondary | ICD-10-CM

## 2023-09-10 DIAGNOSIS — Z79818 Long term (current) use of other agents affecting estrogen receptors and estrogen levels: Secondary | ICD-10-CM

## 2023-09-10 MED ORDER — NONFORMULARY OR COMPOUNDED ITEM: 11 refills | Status: DC

## 2023-09-10 MED ORDER — NONFORMULARY OR COMPOUNDED ITEM: 11 refills | Status: AC

## 2023-09-10 NOTE — Patient Instructions (Signed)
 Phone number for scheduling bone density at [place], [phone].

## 2023-09-14 LAB — TESTOS,TOTAL,FREE AND SHBG (FEMALE)
Free Testosterone: 1.5 pg/mL (ref 0.1–6.4)
Sex Hormone Binding: 52 nmol/L (ref 14–73)
Testosterone, Total, LC-MS-MS: 13 ng/dL (ref 2–45)

## 2023-09-21 ENCOUNTER — Ambulatory Visit (HOSPITAL_BASED_OUTPATIENT_CLINIC_OR_DEPARTMENT_OTHER): Payer: Self-pay | Admitting: Obstetrics and Gynecology

## 2023-10-10 ENCOUNTER — Telehealth: Payer: Self-pay

## 2023-10-10 DIAGNOSIS — R7989 Other specified abnormal findings of blood chemistry: Secondary | ICD-10-CM

## 2023-10-10 NOTE — Telephone Encounter (Addendum)
 Pt called the refill line to say she just called the pharmacy for a testosterone  refill and was told the refill was rejected. Pt would like to know why? Pt is asking if MD would please approve the refill?  Start: 09/10/2023  Testosterone  cream 0.25 mg/ml Place 1 - 2 clicks (0.25 ml to 0.5 ml) daily. Quantity 30 Ordering Department: [facility] Authorized By: Cathlyn JAYSON Nikki Bobie FORBES, MD Dispense: 30 each Refills: 11 ordered Note to Pharmacy: [facility] LAST AEX:  09/07/23 NEXT AEX: 08/11/24 Please advise.

## 2023-10-10 NOTE — Telephone Encounter (Signed)
 I approve this prescription which was sent to [facility] 09/10/23.   Please resend to the pharmacy.

## 2023-10-12 MED ORDER — NONFORMULARY OR COMPOUNDED ITEM: 5 refills | Status: AC

## 2023-10-12 NOTE — Telephone Encounter (Signed)
 Called [facility] and spoke to Janie (different person). Janie says she will refill prescription, but can only do 5 refills.

## 2023-10-12 NOTE — Telephone Encounter (Signed)
 Good morning, I called [facility] and spoke to Lometa. Slater says they never received this prescription. I asked Slater to please check again, and she said the prescription they had on file expired in December, and she could not locate the Rx sent in August. Re-sending Rx now. Thank you.

## 2023-10-15 ENCOUNTER — Telehealth: Payer: Self-pay | Admitting: Obstetrics and Gynecology

## 2023-10-15 NOTE — Telephone Encounter (Signed)
 Spoke with Edsel at [facility]. Confirmed testosterone  Rx received and has been filled.   Routing FYI.   Encounter closed.

## 2023-10-15 NOTE — Telephone Encounter (Signed)
 Please confirm that patient's prescription for testosterone  went through to [facility].

## 2024-01-01 ENCOUNTER — Encounter: Admitting: Internal Medicine

## 2024-02-25 ENCOUNTER — Ambulatory Visit: Admitting: Internal Medicine

## 2024-08-11 ENCOUNTER — Encounter: Payer: Medicare Other | Admitting: Obstetrics and Gynecology

## 2024-09-10 ENCOUNTER — Ambulatory Visit
